# Patient Record
Sex: Female | Born: 1985 | Race: White | Hispanic: No | Marital: Single | Smoking: Current every day smoker
Health system: Southern US, Community
[De-identification: ages and names within clinical notes are randomized; demographics above are authoritative.]

## PROBLEM LIST (undated history)

## (undated) ENCOUNTER — Inpatient Hospital Stay (HOSPITAL_COMMUNITY): Payer: Self-pay

## (undated) ENCOUNTER — Inpatient Hospital Stay: Admission: EM | Payer: Self-pay | Source: Home / Self Care

## (undated) DIAGNOSIS — M5126 Other intervertebral disc displacement, lumbar region: Secondary | ICD-10-CM

## (undated) DIAGNOSIS — E559 Vitamin D deficiency, unspecified: Secondary | ICD-10-CM

## (undated) DIAGNOSIS — F988 Other specified behavioral and emotional disorders with onset usually occurring in childhood and adolescence: Secondary | ICD-10-CM

## (undated) DIAGNOSIS — D649 Anemia, unspecified: Secondary | ICD-10-CM

## (undated) DIAGNOSIS — J45909 Unspecified asthma, uncomplicated: Secondary | ICD-10-CM

## (undated) HISTORY — DX: Unspecified asthma, uncomplicated: J45.909

## (undated) HISTORY — PX: UTERINE SUSPENSION: SUR1430

## (undated) HISTORY — DX: Other intervertebral disc displacement, lumbar region: M51.26

## (undated) HISTORY — PX: IUD REMOVAL: SHX5392

## (undated) HISTORY — DX: Vitamin D deficiency, unspecified: E55.9

## (undated) HISTORY — PX: DIAGNOSTIC LAPAROSCOPY: SUR761

---
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
## 2008-03-20 ENCOUNTER — Other Ambulatory Visit: Admission: RE | Admit: 2008-03-20 | Discharge: 2008-03-20 | Payer: Self-pay | Admitting: Obstetrics and Gynecology

## 2008-09-26 ENCOUNTER — Inpatient Hospital Stay (HOSPITAL_COMMUNITY): Admission: AD | Admit: 2008-09-26 | Discharge: 2008-09-26 | Payer: Self-pay | Admitting: Family Medicine

## 2008-10-25 ENCOUNTER — Inpatient Hospital Stay (HOSPITAL_COMMUNITY): Admission: AD | Admit: 2008-10-25 | Discharge: 2008-10-25 | Payer: Self-pay | Admitting: Family Medicine

## 2008-10-31 ENCOUNTER — Ambulatory Visit: Payer: Self-pay | Admitting: Obstetrics & Gynecology

## 2008-10-31 ENCOUNTER — Inpatient Hospital Stay (HOSPITAL_COMMUNITY): Admission: AD | Admit: 2008-10-31 | Discharge: 2008-11-04 | Payer: Self-pay | Admitting: Obstetrics & Gynecology

## 2008-11-01 ENCOUNTER — Encounter: Payer: Self-pay | Admitting: Obstetrics & Gynecology

## 2009-01-29 ENCOUNTER — Ambulatory Visit (HOSPITAL_COMMUNITY): Admission: RE | Admit: 2009-01-29 | Discharge: 2009-01-29 | Payer: Self-pay | Admitting: Obstetrics and Gynecology

## 2009-02-13 ENCOUNTER — Ambulatory Visit (HOSPITAL_COMMUNITY): Admission: RE | Admit: 2009-02-13 | Discharge: 2009-02-13 | Payer: Self-pay | Admitting: Obstetrics and Gynecology

## 2009-03-28 ENCOUNTER — Ambulatory Visit (HOSPITAL_COMMUNITY): Admission: RE | Admit: 2009-03-28 | Discharge: 2009-03-28 | Payer: Self-pay | Admitting: Obstetrics and Gynecology

## 2010-05-02 LAB — CBC
HCT: 39.1 % (ref 36.0–46.0)
Hemoglobin: 12.9 g/dL (ref 12.0–15.0)
MCHC: 32.9 g/dL (ref 30.0–36.0)
MCV: 84.3 fL (ref 78.0–100.0)
Platelets: 252 10*3/uL (ref 150–400)
WBC: 7.1 10*3/uL (ref 4.0–10.5)

## 2010-05-02 LAB — HCG, QUANTITATIVE, PREGNANCY: hCG, Beta Chain, Quant, S: <2 m[IU]/mL (ref <5)

## 2010-05-17 LAB — COMPREHENSIVE METABOLIC PANEL
Albumin: 2.9 g/dL — ABNORMAL LOW (ref 3.5–5.2)
Alkaline Phosphatase: 261 U/L — ABNORMAL HIGH (ref 39–117)
Calcium: 9.3 mg/dL (ref 8.4–10.5)
Chloride: 107 mEq/L (ref 96–112)
GFR calc non Af Amer: >60 mL/min (ref >60)
Glucose, Bld: 116 mg/dL — ABNORMAL HIGH (ref 70–99)
Potassium: 4 mEq/L (ref 3.5–5.1)
Sodium: 136 mEq/L (ref 135–145)
Total Bilirubin: 0.6 mg/dL (ref 0.3–1.2)

## 2010-05-17 LAB — CBC
HCT: 20.2 % — ABNORMAL LOW (ref 36.0–46.0)
HCT: 20.5 % — ABNORMAL LOW (ref 36.0–46.0)
HCT: 34.4 % — ABNORMAL LOW (ref 36.0–46.0)
Hemoglobin: 6.9 g/dL — CL (ref 12.0–15.0)
Hemoglobin: 6.9 g/dL — CL (ref 12.0–15.0)
Hemoglobin: 7.1 g/dL — CL (ref 12.0–15.0)
MCHC: 33.3 g/dL (ref 30.0–36.0)
MCHC: 33.7 g/dL (ref 30.0–36.0)
MCHC: 34 g/dL (ref 30.0–36.0)
MCV: 91.2 fL (ref 78.0–100.0)
MCV: 92.1 fL (ref 78.0–100.0)
MCV: 92.5 fL (ref 78.0–100.0)
Platelets: 154 10*3/uL (ref 150–400)
Platelets: 186 10*3/uL (ref 150–400)
RBC: 2.19 MIL/uL — ABNORMAL LOW (ref 3.87–5.11)
RDW: 13.6 % (ref 11.5–15.5)
WBC: 10.5 10*3/uL (ref 4.0–10.5)
WBC: 8.1 10*3/uL (ref 4.0–10.5)

## 2010-05-17 LAB — DIFFERENTIAL
Eosinophils Relative: 1 % (ref 0–5)
Lymphocytes Relative: 10 % — ABNORMAL LOW (ref 12–46)
Lymphs Abs: 1.4 10*3/uL (ref 0.7–4.0)
Monocytes Absolute: 1.1 10*3/uL — ABNORMAL HIGH (ref 0.1–1.0)
Neutrophils Relative %: 81 % — ABNORMAL HIGH (ref 43–77)

## 2010-05-18 LAB — GC/CHLAMYDIA PROBE AMP, GENITAL: Chlamydia, DNA Probe: NEGATIVE

## 2010-05-18 LAB — WET PREP, GENITAL: Clue Cells Wet Prep HPF POC: NONE SEEN

## 2010-05-21 ENCOUNTER — Other Ambulatory Visit: Payer: Self-pay | Admitting: Obstetrics & Gynecology

## 2010-05-21 ENCOUNTER — Other Ambulatory Visit (HOSPITAL_COMMUNITY)
Admission: RE | Admit: 2010-05-21 | Discharge: 2010-05-21 | Disposition: A | Discharge status: Home / Self Care | Payer: BC Managed Care – PPO | Source: Ambulatory Visit | Attending: Obstetrics & Gynecology | Admitting: Obstetrics & Gynecology

## 2010-05-21 DIAGNOSIS — R87810 Cervical high risk human papillomavirus (HPV) DNA test positive: Secondary | ICD-10-CM | POA: Insufficient documentation

## 2010-05-21 DIAGNOSIS — Z01419 Encounter for gynecological examination (general) (routine) without abnormal findings: Secondary | ICD-10-CM | POA: Insufficient documentation

## 2010-07-03 ENCOUNTER — Other Ambulatory Visit: Payer: Self-pay | Admitting: Obstetrics & Gynecology

## 2010-08-01 ENCOUNTER — Other Ambulatory Visit: Payer: Self-pay | Admitting: Obstetrics & Gynecology

## 2010-08-15 ENCOUNTER — Ambulatory Visit (HOSPITAL_COMMUNITY)
Admission: RE | Admit: 2010-08-15 | Discharge: 2010-08-15 | Disposition: A | Discharge status: Home / Self Care | Payer: BC Managed Care – PPO | Source: Ambulatory Visit | Attending: Obstetrics & Gynecology | Admitting: Obstetrics & Gynecology

## 2010-08-15 ENCOUNTER — Other Ambulatory Visit (HOSPITAL_COMMUNITY): Payer: BC Managed Care – PPO

## 2010-08-15 ENCOUNTER — Other Ambulatory Visit: Payer: Self-pay | Admitting: Obstetrics & Gynecology

## 2010-08-15 ENCOUNTER — Encounter (HOSPITAL_COMMUNITY): Payer: Self-pay

## 2010-08-15 HISTORY — DX: Anemia, unspecified: D64.9

## 2010-08-15 HISTORY — DX: Other specified behavioral and emotional disorders with onset usually occurring in childhood and adolescence: F98.8

## 2010-08-15 LAB — CBC
HCT: 37.1 % (ref 36.0–46.0)
Hemoglobin: 12.6 g/dL (ref 12.0–15.0)
MCH: 31.8 pg (ref 26.0–34.0)
MCHC: 34 g/dL (ref 30.0–36.0)

## 2010-08-15 LAB — COMPREHENSIVE METABOLIC PANEL
BUN: 16 mg/dL (ref 6–23)
Calcium: 9.4 mg/dL (ref 8.4–10.5)
GFR calc Af Amer: >60 mL/min (ref >60)
Glucose, Bld: 70 mg/dL (ref 70–99)
Sodium: 138 mEq/L (ref 135–145)
Total Protein: 6.7 g/dL (ref 6.0–8.3)

## 2010-08-15 LAB — URINALYSIS, ROUTINE W REFLEX MICROSCOPIC
Nitrite: NEGATIVE
Specific Gravity, Urine: 1.025 (ref 1.005–1.030)
Urobilinogen, UA: 0.2 mg/dL (ref 0.0–1.0)

## 2010-08-15 LAB — HCG, QUANTITATIVE, PREGNANCY: hCG, Beta Chain, Quant, S: 1 m[IU]/mL (ref <5)

## 2010-08-15 LAB — URINE MICROSCOPIC-ADD ON

## 2010-08-15 NOTE — Patient Instructions (Addendum)
20 Allison Miles  08/15/2010   Your procedure is scheduled on:  08-21-10  Report to [facility] at 730 AM.  Call this number if you have problems the morning of surgery: [phone]   Remember:   Do not eat food:After Midnight.  Do not drink clear liquids: After Midnight.  Take these medicines the morning of surgery with A SIP OF WATER:  none   Do not wear jewelry, make-up or nail polish.  Do not bring valuables to the hospital.  Contacts, dentures or bridgework may not be worn into surgery.  Leave suitcase in the car. After surgery it may be brought to your room.  For patients admitted to the hospital, checkout time is 11:00 AM the day of discharge.   Patients discharged the day of surgery will not be allowed to drive home.  Name and phone number of your driver: Family member  Special Instructions: CHG Shower Shower 2 days before surgery and 1 day before surgery with Hibiclens.   Please read over the following fact sheets that you were given: Pain Booklet, MRSA Information, Surgical Site Infection Prevention and Anesthesia Post-op Instructions PATIENT INSTRUCTIONS POST-ANESTHESIA  IMMEDIATELY FOLLOWING SURGERY:  Do not drive or operate machinery for the first twenty four hours after surgery.  Do not make any important decisions for twenty four hours after surgery or while taking narcotic pain medications or sedatives.  If you develop intractable nausea and vomiting or a severe headache please notify your doctor immediately.  FOLLOW-UP:  Please make an appointment with your surgeon as instructed. You do not need to follow up with anesthesia unless specifically instructed to do so.  WOUND CARE INSTRUCTIONS (if applicable):  Keep a dry clean dressing on the anesthesia/puncture wound site if there is drainage.  Once the wound has quit draining you may leave it open to air.  Generally you should leave the bandage intact for twenty four hours unless there is drainage.  If the epidural site  drains for more than 36-48 hours please call the anesthesia department.  QUESTIONS?:  Please feel free to call your physician or the hospital operator if you have any questions, and they will be happy to assist you.     [facility] Anesthesia Department [address] [place] [phone]

## 2010-08-15 NOTE — H&P (Signed)
Allison Miles is an 24 y.o. female. [name] had a pap smear on 05/21/2010 which revealed atypical squamous cells of undetermined significance, cannot rule out high grade dysplasia.  I performed a colposcopically directed exam and biopsy on 07/03/2010 and the pathology report returned as high grade squamous intraepithelial lesion.  The colposcopy  was adequate.  As a result she is scheduled for outpatient laser ablation/conization of the cervix.  Pertinent Gynecological History: Menses: flow is light, usually lasting less than 6 days and with minimal cramping Bleeding: regular Contraception: NuvaRing vaginal inserts DES exposure: denies Blood transfusions: none Sexually transmitted diseases: negative Previous GYN Procedures:laparoscopy Last mammogram: N/A Date:  Last pap: abnormal:  Date: 4/2012OB History: G1, P1   Menstrual History: Menarche age:12  Patient's last menstrual period was 07/25/2010.    Past Medical History  Diagnosis Date  . Angina   . Anemia   . ADD (attention deficit disorder) without hyperactivity     Past Surgical History  Procedure Date  . Diagnostic laparoscopy     tacked pelvic ligaments due to prolapse and removed iud    Family History  Problem Relation Age of Onset  . Anesthesia problems Neg Hx   . Hypotension Neg Hx   . Malignant hyperthermia Neg Hx   . Pseudochol deficiency Neg Hx     Social History:  reports that she has been smoking Cigarettes.  She has a 2 pack-year smoking history. She does not have any smokeless tobacco history on file. She reports that she drinks about 1.2 ounces of alcohol per week. She reports that she uses illicit drugs (Marijuana).  Allergies: No Known Allergies   (Not in a hospital admission)  Review of Systems  Constitutional: Negative.   HENT: Negative.   Eyes: Negative.   Respiratory: Negative.   Cardiovascular: Negative.   Gastrointestinal: Negative.   Genitourinary: Negative.   Musculoskeletal: Negative.    Skin: Negative.   Neurological: Negative.   Endo/Heme/Allergies: Negative.   Psychiatric/Behavioral: Negative.     Last menstrual period 07/25/2010. Physical Exam  Constitutional: She is oriented to person, place, and time. She appears well-developed and well-nourished.  HENT:  Head: Normocephalic and atraumatic.  Neck: Normal range of motion. Neck supple. No tracheal deviation present. No thyromegaly present.  Cardiovascular: Normal rate, regular rhythm and normal heart sounds.   Respiratory: Effort normal and breath sounds normal. No respiratory distress. She has no wheezes. She has no rales. She exhibits no tenderness.  GI: Hernia confirmed negative in the right inguinal area and confirmed negative in the left inguinal area.  Genitourinary: Rectum normal and uterus normal. No breast swelling, tenderness, discharge or bleeding. Pelvic exam was performed with patient supine. No labial fusion. There is no rash, tenderness, lesion or injury on the right labia. There is no rash, tenderness, lesion or injury on the left labia. Uterus is not deviated, not enlarged, not fixed and not tender. Cervix exhibits no motion tenderness, no discharge and no friability. Right adnexum displays no mass, no tenderness and no fullness. Left adnexum displays no mass, no tenderness and no fullness. No erythema, tenderness or bleeding around the vagina. No foreign body around the vagina. No signs of injury around the vagina. No vaginal discharge found.  Musculoskeletal: Normal range of motion. She exhibits no edema and no tenderness.  Lymphadenopathy:       Right: No inguinal adenopathy present.       Left: No inguinal adenopathy present.  Neurological: She is alert and oriented to person, place,   and time. She has normal reflexes.  Skin: Skin is warm and dry.  Psychiatric: She has a normal mood and affect. Her behavior is normal. Judgment and thought content normal.     @RESULTS24@  @RISRSLT48@  Assessment/Plan: 1.  High Grade Cervical Dysplasia  Laser ablation/conization of the cervix  EURE,LUTHER H 08/15/2010, 12:41 PM  

## 2010-08-18 ENCOUNTER — Other Ambulatory Visit: Payer: Self-pay | Admitting: Obstetrics & Gynecology

## 2010-08-21 ENCOUNTER — Encounter (HOSPITAL_COMMUNITY): Payer: Self-pay | Admitting: *Deleted

## 2010-08-21 ENCOUNTER — Encounter (HOSPITAL_COMMUNITY)
Admission: RE | Disposition: A | Discharge status: Home / Self Care | Payer: Self-pay | Source: Ambulatory Visit | Attending: Obstetrics & Gynecology

## 2010-08-21 ENCOUNTER — Ambulatory Visit (HOSPITAL_COMMUNITY)
Admission: RE | Admit: 2010-08-21 | Discharge: 2010-08-21 | Disposition: A | Discharge status: Home / Self Care | Payer: BC Managed Care – PPO | Source: Ambulatory Visit | Attending: Obstetrics & Gynecology | Admitting: Obstetrics & Gynecology

## 2010-08-21 ENCOUNTER — Ambulatory Visit (HOSPITAL_COMMUNITY): Payer: BC Managed Care – PPO | Admitting: *Deleted

## 2010-08-21 DIAGNOSIS — Z30432 Encounter for removal of intrauterine contraceptive device: Secondary | ICD-10-CM | POA: Insufficient documentation

## 2010-08-21 DIAGNOSIS — Z87898 Personal history of other specified conditions: Secondary | ICD-10-CM

## 2010-08-21 DIAGNOSIS — N854 Malposition of uterus: Secondary | ICD-10-CM | POA: Insufficient documentation

## 2010-08-21 SURGERY — ABLATION, CERVIX
Anesthesia: General | Site: Cervix | Wound class: Clean Contaminated

## 2010-08-21 MED ORDER — LACTATED RINGERS IV SOLN
INTRAVENOUS | Status: DC
  Administered 2010-08-21: 09:00:00 via INTRAVENOUS

## 2010-08-21 MED ORDER — KETOROLAC TROMETHAMINE 30 MG/ML IJ SOLN
30.0000 mg | Freq: Once | INTRAMUSCULAR | Status: AC
  Administered 2010-08-21: 30 mg via INTRAVENOUS

## 2010-08-21 MED ORDER — CEFAZOLIN SODIUM 1-5 GM-% IV SOLN
INTRAVENOUS | Status: DC | PRN
  Administered 2010-08-21: 1 g via INTRAVENOUS

## 2010-08-21 MED ORDER — MIDAZOLAM HCL 2 MG/2ML IJ SOLN
1.0000 mg | INTRAMUSCULAR | Status: DC | PRN
  Administered 2010-08-21: 2 mg via INTRAVENOUS

## 2010-08-21 MED ORDER — FENTANYL CITRATE 0.05 MG/ML IJ SOLN
INTRAMUSCULAR | Status: DC | PRN
Start: 2010-08-21 — End: 2010-08-21
  Administered 2010-08-21 (×2): 50 ug via INTRAVENOUS

## 2010-08-21 MED ORDER — KETOROLAC TROMETHAMINE 30 MG/ML IJ SOLN: 30.0000 mg | Freq: Once | INTRAMUSCULAR | Status: DC

## 2010-08-21 MED ORDER — SODIUM CHLORIDE 0.9 % IV SOLN
INTRAVENOUS | Status: DC
  Filled 2010-08-21: qty 1000

## 2010-08-21 MED ORDER — FENTANYL CITRATE 0.05 MG/ML IJ SOLN
INTRAMUSCULAR | Status: AC
  Administered 2010-08-21: 25 ug via INTRAVENOUS
  Filled 2010-08-21: qty 2

## 2010-08-21 MED ORDER — LIDOCAINE HCL (PF) 1 % IJ SOLN
INTRAMUSCULAR | Status: AC
  Filled 2010-08-21: qty 5

## 2010-08-21 MED ORDER — DEXTROSE 5 % IV SOLN
INTRAVENOUS | Status: AC
  Filled 2010-08-21: qty 1

## 2010-08-21 MED ORDER — PROPOFOL 10 MG/ML IV EMUL
INTRAVENOUS | Status: DC | PRN
  Administered 2010-08-21: 150 mg via INTRAVENOUS

## 2010-08-21 MED ORDER — PROMETHAZINE HCL 12.5 MG PO TABS: 25.0000 mg | ORAL_TABLET | Freq: Four times a day (QID) | ORAL | Status: AC | PRN

## 2010-08-21 MED ORDER — CEFAZOLIN SODIUM 1-5 GM-% IV SOLN: 1.0000 g | INTRAVENOUS | Status: DC

## 2010-08-21 MED ORDER — FENTANYL CITRATE 0.05 MG/ML IJ SOLN
INTRAMUSCULAR | Status: AC
  Filled 2010-08-21: qty 2

## 2010-08-21 MED ORDER — CEFOXITIN SODIUM-DEXTROSE 1-4 GM-% IV SOLR (PREMIX): 1.0000 g | INTRAVENOUS | Status: DC

## 2010-08-21 MED ORDER — ACETIC ACID 4% SOLUTION
Status: DC | PRN
  Administered 2010-08-21: 1 via TOPICAL

## 2010-08-21 MED ORDER — MIDAZOLAM HCL 2 MG/2ML IJ SOLN
INTRAMUSCULAR | Status: AC
  Filled 2010-08-21: qty 2

## 2010-08-21 MED ORDER — MIDAZOLAM HCL 5 MG/5ML IJ SOLN
INTRAMUSCULAR | Status: DC | PRN
  Administered 2010-08-21: 2 mg via INTRAVENOUS

## 2010-08-21 MED ORDER — PROPOFOL 10 MG/ML IV EMUL
INTRAVENOUS | Status: AC
  Filled 2010-08-21: qty 20

## 2010-08-21 MED ORDER — CEFAZOLIN SODIUM 1-5 GM-% IV SOLN
INTRAVENOUS | Status: AC
  Administered 2010-08-21: 1000 mg
  Filled 2010-08-21: qty 50

## 2010-08-21 MED ORDER — FENTANYL CITRATE 0.05 MG/ML IJ SOLN
25.0000 ug | INTRAMUSCULAR | Status: DC | PRN
  Administered 2010-08-21 (×2): 25 ug via INTRAVENOUS

## 2010-08-21 MED ORDER — FERRIC SUBSULFATE 259 MG/GM EX SOLN
CUTANEOUS | Status: AC
  Filled 2010-08-21: qty 8

## 2010-08-21 MED ORDER — ONDANSETRON HCL 4 MG/2ML IJ SOLN: 4.0000 mg | Freq: Once | INTRAMUSCULAR | Status: DC | PRN

## 2010-08-21 MED ORDER — HYDROCODONE-ACETAMINOPHEN 5-500 MG PO TABS: 1.0000 | ORAL_TABLET | Freq: Four times a day (QID) | ORAL | Status: AC | PRN

## 2010-08-21 MED ORDER — KETOROLAC TROMETHAMINE 30 MG/ML IJ SOLN
INTRAMUSCULAR | Status: AC
  Administered 2010-08-21: 30 mg via INTRAVENOUS
  Filled 2010-08-21: qty 1

## 2010-08-21 MED ORDER — FERRIC SUBSULFATE 259 MG/GM EX SOLN
CUTANEOUS | Status: DC | PRN
  Administered 2010-08-21: 1

## 2010-08-21 SURGICAL SUPPLY — 21 items
APPLICATOR COTTON TIP 6IN STRL (MISCELLANEOUS) ×2 IMPLANT
BAG HAMPER (MISCELLANEOUS) ×2 IMPLANT
CLOTH BEACON ORANGE TIMEOUT ST (SAFETY) ×2 IMPLANT
COVER LIGHT HANDLE STERIS (MISCELLANEOUS) ×4 IMPLANT
COVER TABLE BACK 60X90 (DRAPES) ×2 IMPLANT
GAUZE SPONGE 4X4 16PLY XRAY LF (GAUZE/BANDAGES/DRESSINGS) ×2 IMPLANT
GLOVE BIOGEL PI IND STRL 8 (GLOVE) ×1 IMPLANT
GLOVE BIOGEL PI INDICATOR 8 (GLOVE) ×1
GLOVE ECLIPSE 8.0 STRL XLNG CF (GLOVE) ×2 IMPLANT
GOWN BRE IMP SLV AUR XL STRL (GOWN DISPOSABLE) ×2 IMPLANT
GOWN STRL REIN XL XLG (GOWN DISPOSABLE) IMPLANT
KIT ROOM TURNOVER APOR (KITS) ×2 IMPLANT
LASER FIBER DISP 1000U (UROLOGICAL SUPPLIES) ×2 IMPLANT
MANIFOLD NEPTUNE II (INSTRUMENTS) ×2 IMPLANT
MARKER SKIN DUAL TIP RULER LAB (MISCELLANEOUS) ×2 IMPLANT
PAD ARMBOARD 7.5X6 YLW CONV (MISCELLANEOUS) ×2 IMPLANT
PREFILTER SMOKE EVAC (FILTER) ×2 IMPLANT
SET BASIN LINEN APH (SET/KITS/TRAYS/PACK) ×2 IMPLANT
SWAB PROCTOSCOPIC (MISCELLANEOUS) ×4 IMPLANT
TUBING SMOKE EVAC CO2 (TUBING) ×2 IMPLANT
WATER STERILE IRR 1000ML POUR (IV SOLUTION) ×4 IMPLANT

## 2010-08-21 NOTE — Anesthesia Preprocedure Evaluation (Signed)
Anesthesia Evaluation  Name, MR# and DOB Patient awake  General Assessment Comment  Reviewed: Allergy & Precautions, H&P  and Patient's Chart, lab work & pertinent test results  History of Anesthesia Complications Negative for: history of anesthetic complications  Airway Mallampati: I TM Distance: >3 FB Neck ROM: Full    Dental  (+) Teeth Intact,    Pulmonary    pulmonary exam normal   Cardiovascular + angina (secondary to ADD meds - resolved) Regular Normal   Neuro/Psych (+) {AN ROS/MED HX NEURO HEADACHES (+) PSYCHIATRIC DISORDERS (ADD),  Pos THC use GI/Hepatic/Renal   Endo/Other   Abdominal   Musculoskeletal  Hematology   Peds  Reproductive/Obstetrics   Anesthesia Other Findings             Anesthesia Physical Anesthesia Plan  ASA: II  Anesthesia Plan: General   Post-op Pain Management:    Induction: Intravenous  Airway Management Planned: LMA  Additional Equipment:   Intra-op Plan:   Post-operative Plan: Extubation in OR  Informed Consent: I have reviewed the patients History and Physical, chart, labs and discussed the procedure including the risks, benefits and alternatives for the proposed anesthesia with the patient or authorized representative who has indicated his/her understanding and acceptance.     Plan Discussed with: CRNA  Anesthesia Plan Comments:         Anesthesia Quick Evaluation

## 2010-08-21 NOTE — Op Note (Signed)
Preoperative Diagnosis:  High Grade Squamous Intraepithelial lesion, adequate colposcopy  Postoperative Diagnosis:  Same as above  Procedure:  Laser ablation of the cervix  Surgeon:  Lazaro Arms MD  Anaesthesia:  Laryngeal Mask Airway  Findings:  Patient had an abnormal pap smear which was evaluated in the office with colposcopy and directed biopsies.  Pathology report returned as high Grade SIL.  The colposcopy was adequate.  As a result, the patient is admitted for laser ablation of the cervix.  Description of Note:  Patient was taken to the OR and placed in the supine position where she underwent laryngeal mask airway anaesthesia.  She was placed in the dorsal lithotomy position.  She was draped for laser.  Graves speculum was placed and 3% acetic acid used and the laser microscope employed to perform colposcopy which confirmed the office findings.  Laser was used on typical cervical settings and used to vaporized the squamocolumnar junction to  depth of  5-7 mm peripherally and 7-9 mm centrally.  Surgical margin of several mm was employed beyond the acetowhite epithelium.  Hemostasis was achieved with the laser and Monsel's solution.  Patient was awakened from anaesthesia in good stable condition and all counts were correct.  She received Ancef 1 gram and Toradol 30 mg IV preoperatively prophylactically.

## 2010-08-21 NOTE — H&P (View-Only) (Signed)
Allison Miles is an 25 y.o. female. Allison Miles had a pap smear on 05/21/2010 which revealed atypical squamous cells of undetermined significance, cannot rule out high grade dysplasia.  I performed a colposcopically directed exam and biopsy on 07/03/2010 and the pathology report returned as high grade squamous intraepithelial lesion.  The colposcopy  was adequate.  As a result she is scheduled for outpatient laser ablation/conization of the cervix.  Pertinent Gynecological History: Menses: flow is light, usually lasting less than 6 days and with minimal cramping Bleeding: regular Contraception: NuvaRing vaginal inserts DES exposure: denies Blood transfusions: none Sexually transmitted diseases: negative Previous GYN Procedures:laparoscopy Last mammogram: N/A Date:  Last pap: abnormal:  Date: 4/2012OB History: G1, P1   Menstrual History: Menarche age:32  Patient's last menstrual period was 07/25/2010.    Past Medical History  Diagnosis Date  . Angina   . Anemia   . ADD (attention deficit disorder) without hyperactivity     Past Surgical History  Procedure Date  . Diagnostic laparoscopy     tacked pelvic ligaments due to prolapse and removed iud    Family History  Problem Relation Age of Onset  . Anesthesia problems Neg Hx   . Hypotension Neg Hx   . Malignant hyperthermia Neg Hx   . Pseudochol deficiency Neg Hx     Social History:  reports that she has been smoking Cigarettes.  She has a 2 pack-year smoking history. She does not have any smokeless tobacco history on file. She reports that she drinks about 1.2 ounces of alcohol per week. She reports that she uses illicit drugs (Marijuana).  Allergies: No Known Allergies   (Not in a hospital admission)  Review of Systems  Constitutional: Negative.   HENT: Negative.   Eyes: Negative.   Respiratory: Negative.   Cardiovascular: Negative.   Gastrointestinal: Negative.   Genitourinary: Negative.   Musculoskeletal: Negative.    Skin: Negative.   Neurological: Negative.   Endo/Heme/Allergies: Negative.   Psychiatric/Behavioral: Negative.     Last menstrual period 07/25/2010. Physical Exam  Constitutional: She is oriented to person, place, and time. She appears well-developed and well-nourished.  HENT:  Head: Normocephalic and atraumatic.  Neck: Normal range of motion. Neck supple. No tracheal deviation present. No thyromegaly present.  Cardiovascular: Normal rate, regular rhythm and normal heart sounds.   Respiratory: Effort normal and breath sounds normal. No respiratory distress. She has no wheezes. She has no rales. She exhibits no tenderness.  GI: Hernia confirmed negative in the right inguinal area and confirmed negative in the left inguinal area.  Genitourinary: Rectum normal and uterus normal. No breast swelling, tenderness, discharge or bleeding. Pelvic exam was performed with patient supine. No labial fusion. There is no rash, tenderness, lesion or injury on the right labia. There is no rash, tenderness, lesion or injury on the left labia. Uterus is not deviated, not enlarged, not fixed and not tender. Cervix exhibits no motion tenderness, no discharge and no friability. Right adnexum displays no mass, no tenderness and no fullness. Left adnexum displays no mass, no tenderness and no fullness. No erythema, tenderness or bleeding around the vagina. No foreign body around the vagina. No signs of injury around the vagina. No vaginal discharge found.  Musculoskeletal: Normal range of motion. She exhibits no edema and no tenderness.  Lymphadenopathy:       Right: No inguinal adenopathy present.       Left: No inguinal adenopathy present.  Neurological: She is alert and oriented to person, place,  and time. She has normal reflexes.  Skin: Skin is warm and dry.  Psychiatric: She has a normal mood and affect. Her behavior is normal. Judgment and thought content normal.     @RESULTS24 @  @RISRSLT48 @  Assessment/Plan: 1.  High Grade Cervical Dysplasia  Laser ablation/conization of the cervix  Allison Miles,Allison Miles 08/15/2010, 12:41 PM

## 2010-08-21 NOTE — Anesthesia Postprocedure Evaluation (Signed)
  Anesthesia Post-op Note  Patient: Allison Miles  Procedure(s) Performed:  CERVICAL ABLATION  Patient Location: PACU  Anesthesia Type: General  Level of Consciousness: awake  Airway and Oxygen Therapy: Patient Spontanous Breathing  Post-op Pain: none  Post-op Assessment: Patient's Cardiovascular Status Stable, Respiratory Function Stable, Patent Airway and No signs of Nausea or vomiting  Post-op Vital Signs: stable  Complications: No apparent anesthesia complications

## 2010-08-21 NOTE — Anesthesia Procedure Notes (Addendum)
Performed by: Glynn Octave Preoxygenation: Pre-oxygenation with 100% oxygen    Performed by: Glynn Octave    Procedure Name: LMA Insertion Date/Time: 08/21/2010 11:34 AM Performed by: Glynn Octave Pre-anesthesia Checklist: Patient identified, Patient being monitored, Emergency Drugs available, Timeout performed and Suction available Patient Re-evaluated:Patient Re-evaluated prior to inductionOxygen Delivery Method: Circle System Utilized Intubation Type: IV induction Ventilation: Mask ventilation without difficulty LMA: LMA inserted LMA Size: 4.0 Number of attempts: 1 Placement Confirmation: positive ETCO2 and breath sounds checked- equal and bilateral

## 2010-08-21 NOTE — Transfer of Care (Signed)
Immediate Anesthesia Transfer of Care Note  Patient: Allison Miles  Procedure(s) Performed:  CERVICAL ABLATION  Patient Location: PACU  Anesthesia Type: General  Level of Consciousness: awake, alert  and oriented  Airway & Oxygen Therapy: Patient Spontanous Breathing and Patient connected to face mask oxygen  Post-op Assessment: Report given to PACU RN, Post -op Vital signs reviewed and stable and Patient moving all extremities  Post vital signs: stable  Complications: No apparent anesthesia complications

## 2010-08-21 NOTE — Interval H&P Note (Signed)
History and Physical Interval Note:   08/21/2010   7:49 AM   Allison Miles  has presented today for surgery, with the diagnosis of servere dysplasia of cervix  The various methods of treatment have been discussed with the patient and family. After consideration of risks, benefits and other options for treatment, the patient has consented to  Procedure(s): CERVICAL ABLATION as a surgical intervention .  I have reviewed the patients' chart and labs.  Questions were answered to the patient's satisfaction.     Lazaro Arms  MD  There have been no significant clinical changes since the completion of the above H&P.

## 2011-03-12 ENCOUNTER — Other Ambulatory Visit: Payer: Self-pay | Admitting: Obstetrics & Gynecology

## 2011-03-12 ENCOUNTER — Other Ambulatory Visit (HOSPITAL_COMMUNITY)
Admission: RE | Admit: 2011-03-12 | Discharge: 2011-03-12 | Disposition: A | Discharge status: Home / Self Care | Payer: Commercial Managed Care - PPO | Source: Ambulatory Visit | Attending: Obstetrics & Gynecology | Admitting: Obstetrics & Gynecology

## 2011-03-12 DIAGNOSIS — Z01419 Encounter for gynecological examination (general) (routine) without abnormal findings: Secondary | ICD-10-CM | POA: Insufficient documentation

## 2011-12-26 ENCOUNTER — Encounter (HOSPITAL_COMMUNITY): Payer: Self-pay | Admitting: *Deleted

## 2011-12-26 ENCOUNTER — Inpatient Hospital Stay (HOSPITAL_COMMUNITY)
Admission: AD | Admit: 2011-12-26 | Discharge: 2011-12-26 | Disposition: A | Discharge status: Home / Self Care | Payer: Medicaid Other | Source: Ambulatory Visit | Attending: Obstetrics and Gynecology | Admitting: Obstetrics and Gynecology

## 2011-12-26 DIAGNOSIS — R109 Unspecified abdominal pain: Secondary | ICD-10-CM | POA: Insufficient documentation

## 2011-12-26 DIAGNOSIS — K529 Noninfective gastroenteritis and colitis, unspecified: Secondary | ICD-10-CM

## 2011-12-26 DIAGNOSIS — J069 Acute upper respiratory infection, unspecified: Secondary | ICD-10-CM | POA: Insufficient documentation

## 2011-12-26 DIAGNOSIS — O99891 Other specified diseases and conditions complicating pregnancy: Secondary | ICD-10-CM | POA: Insufficient documentation

## 2011-12-26 DIAGNOSIS — M545 Low back pain, unspecified: Secondary | ICD-10-CM | POA: Insufficient documentation

## 2011-12-26 DIAGNOSIS — E860 Dehydration: Secondary | ICD-10-CM | POA: Insufficient documentation

## 2011-12-26 DIAGNOSIS — K5289 Other specified noninfective gastroenteritis and colitis: Secondary | ICD-10-CM | POA: Insufficient documentation

## 2011-12-26 LAB — WET PREP, GENITAL
Trich, Wet Prep: NONE SEEN
Yeast Wet Prep HPF POC: NONE SEEN

## 2011-12-26 LAB — URINALYSIS, ROUTINE W REFLEX MICROSCOPIC
Glucose, UA: NEGATIVE mg/dL
Hgb urine dipstick: NEGATIVE
Specific Gravity, Urine: 1.02 (ref 1.005–1.030)

## 2011-12-26 NOTE — MAU Note (Signed)
Pt presents with complaints of cramping since last pm.

## 2011-12-26 NOTE — MAU Provider Note (Signed)
History     CSN: [id number]  Arrival date and time: 12/26/11 1731   None     Chief Complaint  Patient presents with  . Abdominal Pain   HPI 26 y.o. G2P1001 at [redacted]w[redacted]d with lower abdominal pain/cramping and low back pain since last night. Then nausea and vomiting today with headache. Cramping comes in waves, sometimes feels like contractions every 8 to 10 min for a while and then goes away. No bleeding or loss fluid. But is feeling some increased vaginal pressure off and on. Normal vaginal discharge. Has had URI with cough and green sputum lately. Cough makes pain/pressure worse.   OB History    Grav Para Term Preterm Abortions TAB SAB Ect Mult Living   2 1 1  0 0 0 0 0 0 1      Past Medical History  Diagnosis Date  . Angina   . Anemia   . ADD (attention deficit disorder) without hyperactivity     Past Surgical History  Procedure Date  . Diagnostic laparoscopy     tacked pelvic ligaments due to prolapse and removed iud    Family History  Problem Relation Age of Onset  . Anesthesia problems Neg Hx   . Hypotension Neg Hx   . Malignant hyperthermia Neg Hx   . Pseudochol deficiency Neg Hx     History  Substance Use Topics  . Smoking status: Current Every Day Smoker -- 1.0 packs/day for 2 years    Types: Cigarettes  . Smokeless tobacco: Not on file  . Alcohol Use: 1.2 oz/week    2 Cans of beer per week  No EtOH since pregnant.  Allergies:  Allergies  Allergen Reactions  . Lisdexamfetamine Other (See Comments)    "Pounding Heart" Pt. States  It made her heart beat fast, that she really wasn't allergic to it.    Prescriptions prior to admission  Medication Sig Dispense Refill  . acetaminophen (TYLENOL) 500 MG tablet Take 1,000 mg by mouth every 6 (six) hours as needed. Head ache      . Pediatric Multiple Vit-C-FA (FLINSTONES GUMMIES OMEGA-3 DHA) CHEW Chew 1 tablet by mouth daily.        Review of Systems  Constitutional: Negative for fever and chills.    Gastrointestinal: Positive for nausea, vomiting and abdominal pain. Negative for diarrhea and constipation.  Genitourinary: Negative for dysuria and urgency.  Musculoskeletal: Positive for back pain.  Neurological: Positive for headaches (resolved). Negative for dizziness.   Physical Exam   Blood pressure 122/75, pulse 95, resp. rate 16, height 5\' 10"  (1.778 m), weight 87.544 kg (193 lb), last menstrual period 06/24/2011.  Physical Exam  Constitutional: She is oriented to person, place, and time. She appears well-developed and well-nourished. No distress.  HENT:  Head: Normocephalic and atraumatic.  Eyes: Conjunctivae normal and EOM are normal.  Neck: Normal range of motion. Neck supple.  Cardiovascular: Normal rate, regular rhythm and normal heart sounds.   Respiratory: Effort normal and breath sounds normal.  GI: Bowel sounds are normal. There is tenderness (mild suprapubic). There is no rebound and no guarding.  Genitourinary:       External genitalia normal. Normal vagina. Small amount mucous discharge and some milky discharge. Cervix visually < 0.5 cm.   Musculoskeletal: Normal range of motion. She exhibits no edema and no tenderness.  Neurological: She is alert and oriented to person, place, and time.  Skin: Skin is warm and dry.  Psychiatric: She has a normal mood and affect.  Results for orders placed during the hospital encounter of 12/26/11 (from the past 24 hour(s))  URINALYSIS, ROUTINE W REFLEX MICROSCOPIC     Status: Abnormal   Collection Time   12/26/11  5:50 PM      Component Value Range   Color, Urine YELLOW  YELLOW   APPearance HAZY (*) CLEAR   Specific Gravity, Urine 1.020  1.005 - 1.030   pH 7.0  5.0 - 8.0   Glucose, UA NEGATIVE  NEGATIVE mg/dL   Hgb urine dipstick NEGATIVE  NEGATIVE   Bilirubin Urine NEGATIVE  NEGATIVE   Ketones, ur 40 (*) NEGATIVE mg/dL   Protein, ur NEGATIVE  NEGATIVE mg/dL   Urobilinogen, UA 0.2  0.0 - 1.0 mg/dL   Nitrite NEGATIVE   NEGATIVE   Leukocytes, UA NEGATIVE  NEGATIVE  WET PREP, GENITAL     Status: Abnormal   Collection Time   12/26/11  6:20 PM      Component Value Range   Yeast Wet Prep HPF POC NONE SEEN  NONE SEEN   Trich, Wet Prep NONE SEEN  NONE SEEN   Clue Cells Wet Prep HPF POC NONE SEEN  NONE SEEN   WBC, Wet Prep HPF POC FEW (*) NONE SEEN    MAU Course  Procedures  NST:  125, moderate variability, accels present, occasional short variable TOCO: no contractions, some irritability   Assessment and Plan  26 y.o. G2P1001 at [redacted]w[redacted]d with mild dehydration due to URI/Gastroenteritis - Encourage PO fluids - Not contracting, no cervical change - NST reactive  Napoleon Form, MD  Napoleon Form 12/26/2011, 6:08 PM

## 2012-02-11 NOTE — L&D Delivery Note (Signed)
I examined pt and agree with documentation above and nurse midwife student plan of care. MUHAMMAD,Leomar Westberg  

## 2012-02-11 NOTE — L&D Delivery Note (Signed)
Delivery Note At 3:47 PM a viable female was delivered via Vaginal, Spontaneous Delivery (Presentation: LOA  ).  APGAR: 9, 9; weight: to be determined.   Placenta status: Intact, Spontaneous.  Cord: 3 vessels. Baby delivered with double tight nuchal cord that was not easily reduced. Cord was cut and untangled. Patient crying during entire time.   Anesthesia: Epidural  Episiotomy: None Lacerations: 2nd degree Suture Repair: 3.0 vicryl Est. Blood Loss (mL): 300  Mom to postpartum.  Baby to nursery-stable.  [name] 03/17/2012, 4:37 PM  Eino Farber Jerol[facility] was present throughout delivery and performed the repair.

## 2012-03-11 ENCOUNTER — Inpatient Hospital Stay (HOSPITAL_COMMUNITY)
Admission: AD | Admit: 2012-03-11 | Discharge: 2012-03-11 | Disposition: A | Discharge status: Home / Self Care | Payer: Medicaid Other | Source: Ambulatory Visit | Attending: Obstetrics and Gynecology | Admitting: Obstetrics and Gynecology

## 2012-03-11 ENCOUNTER — Encounter (HOSPITAL_COMMUNITY): Payer: Self-pay | Admitting: *Deleted

## 2012-03-11 DIAGNOSIS — O479 False labor, unspecified: Secondary | ICD-10-CM

## 2012-03-11 NOTE — ED Provider Notes (Signed)
None     Chief Complaint:  Labor Eval   Allison Miles is  27 y.o. G2P1001 at [redacted]w[redacted]d presents complaining of Labor Eval .  She states regular, every 4 minutes contractions are associated with none vaginal bleeding, intact membranes, along with active fetal movement. Contractions started 2/5 hours ago, getting regular about 1 hour ago  Obstetrical/Gynecological History: Menstrual History: OB History    Grav Para Term Preterm Abortions TAB SAB Ect Mult Living   2 1 1  0 0 0 0 0 0 1       Patient's last menstrual period was 06/24/2011.     Past Medical History: Past Medical History  Diagnosis Date  . Anemia   . ADD (attention deficit disorder) without hyperactivity     Past Surgical History: Past Surgical History  Procedure Date  . Diagnostic laparoscopy     tacked pelvic ligaments due to prolapse and removed iud  . Cesarean section   . Uterine suspension   . Iud removal     Family History: Family History  Problem Relation Age of Onset  . Anesthesia problems Neg Hx   . Hypotension Neg Hx   . Malignant hyperthermia Neg Hx   . Pseudochol deficiency Neg Hx     Social History: History  Substance Use Topics  . Smoking status: Current Every Day Smoker -- 1.0 packs/day for 2 years    Types: Cigarettes  . Smokeless tobacco: Not on file  . Alcohol Use: No    Allergies:  Allergies  Allergen Reactions  . Lisdexamfetamine Other (See Comments)    "Pounding Heart" Pt. States  It made her heart beat fast, that she really wasn't allergic to it.    Meds:  Prescriptions prior to admission  Medication Sig Dispense Refill  . acetaminophen (TYLENOL) 500 MG tablet Take 1,000 mg by mouth every 6 (six) hours as needed. Head ache      . calcium carbonate (TUMS - DOSED IN MG ELEMENTAL CALCIUM) 500 MG chewable tablet Chew 1 tablet by mouth daily.      . metroNIDAZOLE (FLAGYL) 500 MG tablet Take 500 mg by mouth 2 (two) times daily.      . Pediatric Multiple Vit-C-FA  (FLINSTONES GUMMIES OMEGA-3 DHA) CHEW Chew 1 tablet by mouth daily.        Review of Systems - Please refer to the aforementioned patients' reports.     Physical Exam  Blood pressure 121/79, pulse 100, temperature 97.8 F (36.6 C), temperature source Oral, resp. rate 18, height 5\' 10"  (1.778 m), weight 216 lb 9.6 oz (98.249 kg), last menstrual period 06/24/2011. GENERAL: Well-developed, well-nourished female in no acute distress.  LUNGS: Clear to auscultation bilaterally.  HEART: Regular rate and rhythm. ABDOMEN: Soft, nontender, nondistended, gravid.  EXTREMITIES: Nontender, no edema, 2+ distal pulses. CERVICAL EXAM: Dilatation 2.5cm   Effacement 50%   Station -2   Presentation: cephalic FHT:  Baseline rate 140 bpm   Variability moderate  Accelerations present   Decelerations none Contractions: Every 3-4 mins   Labs: No results found for this or any previous visit (from the past 24 hour(s)). Imaging Studies:  No results found.  Assessment: Allison Miles is  27 y.o. G2P1001 at [redacted]w[redacted]d presents with possible early vs false labor.  Plan: Observe for a while--pt is a TOLAC and lives 45 minutes away  Addendum:  After several hours, pt's contractions dwindled down to almost nothing.  No change in cervix.  Pt d/c'd home with diagnosis of false  labor  Allison Miles,Allison Miles 1/30/20142:27 AM

## 2012-03-11 NOTE — MAU Note (Signed)
Pt G2 P1 at 37.2wks having contractions every since 2350.  Denies leaking or bleeding.

## 2012-03-11 NOTE — MAU Note (Signed)
Pt up to bathroom.

## 2012-03-17 ENCOUNTER — Inpatient Hospital Stay (HOSPITAL_COMMUNITY)
Admission: AD | Admit: 2012-03-17 | Discharge: 2012-03-18 | DRG: 775 | Disposition: A | Discharge status: Home / Self Care | Payer: Medicaid Other | Source: Ambulatory Visit | Attending: Obstetrics and Gynecology | Admitting: Obstetrics and Gynecology

## 2012-03-17 ENCOUNTER — Encounter (HOSPITAL_COMMUNITY): Payer: Self-pay | Admitting: *Deleted

## 2012-03-17 ENCOUNTER — Inpatient Hospital Stay (HOSPITAL_COMMUNITY): Payer: Medicaid Other | Admitting: Anesthesiology

## 2012-03-17 ENCOUNTER — Encounter (HOSPITAL_COMMUNITY): Payer: Self-pay | Admitting: Anesthesiology

## 2012-03-17 DIAGNOSIS — O99344 Other mental disorders complicating childbirth: Secondary | ICD-10-CM

## 2012-03-17 DIAGNOSIS — O34219 Maternal care for unspecified type scar from previous cesarean delivery: Secondary | ICD-10-CM | POA: Diagnosis present

## 2012-03-17 DIAGNOSIS — O99334 Smoking (tobacco) complicating childbirth: Secondary | ICD-10-CM | POA: Diagnosis present

## 2012-03-17 DIAGNOSIS — F1210 Cannabis abuse, uncomplicated: Secondary | ICD-10-CM | POA: Diagnosis present

## 2012-03-17 LAB — TYPE AND SCREEN
ABO/RH(D): A POS
Antibody Screen: NEGATIVE

## 2012-03-17 LAB — CBC
HCT: 35.8 % — ABNORMAL LOW (ref 36.0–46.0)
MCH: 30.3 pg (ref 26.0–34.0)
MCV: 88.8 fL (ref 78.0–100.0)
RBC: 4.03 MIL/uL (ref 3.87–5.11)
RDW: 13.2 % (ref 11.5–15.5)
WBC: 15.6 10*3/uL — ABNORMAL HIGH (ref 4.0–10.5)

## 2012-03-17 LAB — OB RESULTS CONSOLE ABO/RH: RH Type: POSITIVE

## 2012-03-17 LAB — OB RESULTS CONSOLE GBS: GBS: NEGATIVE

## 2012-03-17 LAB — OB RESULTS CONSOLE HIV ANTIBODY (ROUTINE TESTING): HIV: NONREACTIVE

## 2012-03-17 LAB — OB RESULTS CONSOLE GC/CHLAMYDIA
Chlamydia: NEGATIVE
Gonorrhea: NEGATIVE

## 2012-03-17 LAB — OB RESULTS CONSOLE HEPATITIS B SURFACE ANTIGEN: Hepatitis B Surface Ag: NEGATIVE

## 2012-03-17 LAB — OB RESULTS CONSOLE ANTIBODY SCREEN: Antibody Screen: NEGATIVE

## 2012-03-17 LAB — OB RESULTS CONSOLE RUBELLA ANTIBODY, IGM: Rubella: IMMUNE

## 2012-03-17 MED ORDER — OXYCODONE-ACETAMINOPHEN 5-325 MG PO TABS: 1.0000 | ORAL_TABLET | ORAL | Status: DC | PRN

## 2012-03-17 MED ORDER — FENTANYL 2.5 MCG/ML BUPIVACAINE 1/10 % EPIDURAL INFUSION (WH - ANES)
INTRAMUSCULAR | Status: AC
  Filled 2012-03-17: qty 125

## 2012-03-17 MED ORDER — LANOLIN HYDROUS EX OINT: TOPICAL_OINTMENT | CUTANEOUS | Status: DC | PRN

## 2012-03-17 MED ORDER — FENTANYL 2.5 MCG/ML BUPIVACAINE 1/10 % EPIDURAL INFUSION (WH - ANES)
INTRAMUSCULAR | Status: DC | PRN
  Administered 2012-03-17: 16 mL/h via EPIDURAL

## 2012-03-17 MED ORDER — LIDOCAINE HCL (PF) 1 % IJ SOLN
30.0000 mL | INTRAMUSCULAR | Status: DC | PRN
  Administered 2012-03-17: 30 mL via SUBCUTANEOUS
  Filled 2012-03-17: qty 30

## 2012-03-17 MED ORDER — NALBUPHINE SYRINGE 5 MG/0.5 ML: 5.0000 mg | INJECTION | INTRAMUSCULAR | Status: DC | PRN

## 2012-03-17 MED ORDER — IBUPROFEN 600 MG PO TABS
600.0000 mg | ORAL_TABLET | Freq: Four times a day (QID) | ORAL | Status: DC
  Administered 2012-03-17 – 2012-03-18 (×5): 600 mg via ORAL
  Filled 2012-03-17 (×5): qty 1

## 2012-03-17 MED ORDER — FLEET ENEMA 7-19 GM/118ML RE ENEM: 1.0000 | ENEMA | RECTAL | Status: DC | PRN

## 2012-03-17 MED ORDER — PHENYLEPHRINE 40 MCG/ML (10ML) SYRINGE FOR IV PUSH (FOR BLOOD PRESSURE SUPPORT): 80.0000 ug | PREFILLED_SYRINGE | INTRAVENOUS | Status: DC | PRN

## 2012-03-17 MED ORDER — OXYTOCIN BOLUS FROM INFUSION: 500.0000 mL | INTRAVENOUS | Status: DC

## 2012-03-17 MED ORDER — EPHEDRINE 5 MG/ML INJ: 10.0000 mg | INTRAVENOUS | Status: DC | PRN

## 2012-03-17 MED ORDER — PHENYLEPHRINE 40 MCG/ML (10ML) SYRINGE FOR IV PUSH (FOR BLOOD PRESSURE SUPPORT)
PREFILLED_SYRINGE | INTRAVENOUS | Status: AC
  Filled 2012-03-17: qty 5

## 2012-03-17 MED ORDER — DIPHENHYDRAMINE HCL 50 MG/ML IJ SOLN: 12.5000 mg | INTRAMUSCULAR | Status: DC | PRN

## 2012-03-17 MED ORDER — OXYTOCIN 40 UNITS IN LACTATED RINGERS INFUSION - SIMPLE MED
62.5000 mL/h | INTRAVENOUS | Status: DC
  Administered 2012-03-17: 62.5 mL/h via INTRAVENOUS

## 2012-03-17 MED ORDER — LACTATED RINGERS IV SOLN: 500.0000 mL | Freq: Once | INTRAVENOUS | Status: DC

## 2012-03-17 MED ORDER — SIMETHICONE 80 MG PO CHEW: 80.0000 mg | CHEWABLE_TABLET | ORAL | Status: DC | PRN

## 2012-03-17 MED ORDER — ZOLPIDEM TARTRATE 5 MG PO TABS: 5.0000 mg | ORAL_TABLET | Freq: Every evening | ORAL | Status: DC | PRN

## 2012-03-17 MED ORDER — ONDANSETRON HCL 4 MG/2ML IJ SOLN: 4.0000 mg | Freq: Four times a day (QID) | INTRAMUSCULAR | Status: DC | PRN

## 2012-03-17 MED ORDER — OXYTOCIN 40 UNITS IN LACTATED RINGERS INFUSION - SIMPLE MED
INTRAVENOUS | Status: AC
  Filled 2012-03-17: qty 1000

## 2012-03-17 MED ORDER — ONDANSETRON HCL 4 MG/2ML IJ SOLN: 4.0000 mg | INTRAMUSCULAR | Status: DC | PRN

## 2012-03-17 MED ORDER — FENTANYL 2.5 MCG/ML BUPIVACAINE 1/10 % EPIDURAL INFUSION (WH - ANES): 14.0000 mL/h | INTRAMUSCULAR | Status: DC

## 2012-03-17 MED ORDER — PRENATAL MULTIVITAMIN CH
1.0000 | ORAL_TABLET | Freq: Every day | ORAL | Status: DC
  Administered 2012-03-18: 1 via ORAL
  Filled 2012-03-17: qty 1

## 2012-03-17 MED ORDER — EPHEDRINE 5 MG/ML INJ
INTRAVENOUS | Status: AC
  Filled 2012-03-17: qty 4

## 2012-03-17 MED ORDER — DIBUCAINE 1 % RE OINT: 1.0000 "application " | TOPICAL_OINTMENT | RECTAL | Status: DC | PRN

## 2012-03-17 MED ORDER — DIPHENHYDRAMINE HCL 25 MG PO CAPS: 25.0000 mg | ORAL_CAPSULE | Freq: Four times a day (QID) | ORAL | Status: DC | PRN

## 2012-03-17 MED ORDER — OXYCODONE-ACETAMINOPHEN 5-325 MG PO TABS
1.0000 | ORAL_TABLET | ORAL | Status: DC | PRN
  Administered 2012-03-17 – 2012-03-18 (×5): 1 via ORAL
  Filled 2012-03-17 (×6): qty 1

## 2012-03-17 MED ORDER — ACETAMINOPHEN 325 MG PO TABS: 650.0000 mg | ORAL_TABLET | ORAL | Status: DC | PRN

## 2012-03-17 MED ORDER — IBUPROFEN 600 MG PO TABS: 600.0000 mg | ORAL_TABLET | Freq: Four times a day (QID) | ORAL | Status: DC | PRN

## 2012-03-17 MED ORDER — BENZOCAINE-MENTHOL 20-0.5 % EX AERO
1.0000 "application " | INHALATION_SPRAY | CUTANEOUS | Status: DC | PRN
  Administered 2012-03-17: 1 via TOPICAL
  Filled 2012-03-17: qty 56

## 2012-03-17 MED ORDER — CITRIC ACID-SODIUM CITRATE 334-500 MG/5ML PO SOLN: 30.0000 mL | ORAL | Status: DC | PRN

## 2012-03-17 MED ORDER — TETANUS-DIPHTH-ACELL PERTUSSIS 5-2.5-18.5 LF-MCG/0.5 IM SUSP
0.5000 mL | Freq: Once | INTRAMUSCULAR | Status: AC
  Administered 2012-03-18: 0.5 mL via INTRAMUSCULAR
  Filled 2012-03-17: qty 0.5

## 2012-03-17 MED ORDER — SENNOSIDES-DOCUSATE SODIUM 8.6-50 MG PO TABS
2.0000 | ORAL_TABLET | Freq: Every day | ORAL | Status: DC
  Administered 2012-03-17: 2 via ORAL

## 2012-03-17 MED ORDER — ONDANSETRON HCL 4 MG PO TABS: 4.0000 mg | ORAL_TABLET | ORAL | Status: DC | PRN

## 2012-03-17 MED ORDER — BENZONATATE 100 MG PO CAPS
100.0000 mg | ORAL_CAPSULE | Freq: Three times a day (TID) | ORAL | Status: DC | PRN
  Administered 2012-03-17 – 2012-03-18 (×2): 100 mg via ORAL
  Filled 2012-03-17 (×4): qty 1

## 2012-03-17 MED ORDER — LACTATED RINGERS IV SOLN
INTRAVENOUS | Status: DC
  Administered 2012-03-17 (×2): via INTRAVENOUS

## 2012-03-17 MED ORDER — WITCH HAZEL-GLYCERIN EX PADS: 1.0000 "application " | MEDICATED_PAD | CUTANEOUS | Status: DC | PRN

## 2012-03-17 MED ORDER — LIDOCAINE HCL (PF) 1 % IJ SOLN
INTRAMUSCULAR | Status: DC | PRN
  Administered 2012-03-17 (×2): 5 mL

## 2012-03-17 MED ORDER — LACTATED RINGERS IV SOLN: 500.0000 mL | INTRAVENOUS | Status: DC | PRN

## 2012-03-17 NOTE — Anesthesia Preprocedure Evaluation (Signed)
Anesthesia Evaluation  Patient identified by MRN, date of birth, ID band Patient awake    Reviewed: Allergy & Precautions, H&P , Patient's Chart, lab work & pertinent test results  Airway Mallampati: III TM Distance: >3 FB Neck ROM: full    Dental No notable dental hx. (+) Teeth Intact   Pulmonary Current Smoker,  breath sounds clear to auscultation  Pulmonary exam normal       Cardiovascular negative cardio ROS  Rhythm:regular Rate:Normal     Neuro/Psych ADD negative psych ROS   GI/Hepatic Neg liver ROS, GERD-  Controlled and Medicated,  Endo/Other  negative endocrine ROS  Renal/GU negative Renal ROS  negative genitourinary   Musculoskeletal   Abdominal   Peds  Hematology negative hematology ROS (+) anemia ,   Anesthesia Other Findings   Reproductive/Obstetrics (+) Pregnancy                           Anesthesia Physical Anesthesia Plan  ASA: II  Anesthesia Plan: Epidural   Post-op Pain Management:    Induction:   Airway Management Planned:   Additional Equipment:   Intra-op Plan:   Post-operative Plan:   Informed Consent: I have reviewed the patients History and Physical, chart, labs and discussed the procedure including the risks, benefits and alternatives for the proposed anesthesia with the patient or authorized representative who has indicated his/her understanding and acceptance.     Plan Discussed with: Anesthesiologist  Anesthesia Plan Comments:         Anesthesia Quick Evaluation

## 2012-03-17 NOTE — Anesthesia Postprocedure Evaluation (Signed)
  Anesthesia Post-op Note  Patient: Allison Miles  Procedure(s) Performed: * No procedures listed *  Patient Location: Mother/Baby  Anesthesia Type:Epidural  Level of Consciousness: awake, alert  and oriented  Airway and Oxygen Therapy: Patient Spontanous Breathing  Post-op Pain: none  Post-op Assessment: Post-op Vital signs reviewed  Post-op Vital Signs: Reviewed and stable  Complications: No apparent anesthesia complications

## 2012-03-17 NOTE — MAU Note (Signed)
Started at 0500, every 4 minutes. No problems, prior c/s plans for vbac.  Was checked yesterday was 2+.  Little bit of brownish d/c this morning, ? Leaking this mornning.

## 2012-03-17 NOTE — Progress Notes (Signed)
   Subjective: Reports increased pressure.  Objective: BP 127/71  Pulse 89  Temp 97.9 F (36.6 C) (Oral)  Resp 18  Ht 5\' 8"  (1.727 m)  Wt 97.977 kg (216 lb)  BMI 32.84 kg/m2  SpO2 99%  LMP 06/24/2011      FHT:  FHR: 130's bpm, variability: moderate,  accelerations:  Present,  decelerations:  Absent UC:   regular, every 2-3 minutes SVE:   Dilation: 10 Effacement (%): 100 Station: +1 Exam by:: lee  Labs: Lab Results  Component Value Date   WBC 15.6* 03/17/2012   HGB 12.2 03/17/2012   HCT 35.8* 03/17/2012   MCV 88.8 03/17/2012   PLT 273 03/17/2012    Assessment / Plan: Spontaneous labor, progressing normally  Labor: Progressing normally Preeclampsia:  n/a Fetal Wellbeing:  Category I Pain Control:  Epidural I/D:  n/a Anticipated MOD:  NSVD AROM > non particulate meconium. [facility] 03/17/2012, 3:03 PM

## 2012-03-17 NOTE — H&P (Signed)
Allison Miles is a 27 y.o. G2P1001 at 38.1 wga by LMP presenting for contractions and evaluation for labor.  Started feeling contractions every 7 minutes at 4am today. She reports some brown discharge today. No vaginal bleeding. Felt some fluid today in the MAU.  Fern negative x2.   Pregnancy complicated by:  - smoker: cigs 1 pack per day - umbilical hernia, reduced - THC positive on UDS - TOLAC; c-section in 2010 for failure to descend (consent papers signed and in chart)  History OB History    Grav Para Term Preterm Abortions TAB SAB Ect Mult Living   2 1 1  0 0 0 0 0 0 1     Past Medical History  Diagnosis Date  . Anemia   . ADD (attention deficit disorder) without hyperactivity    Past Surgical History  Procedure Date  . Diagnostic laparoscopy     tacked pelvic ligaments due to prolapse and removed iud  . Cesarean section   . Uterine suspension   . Iud removal    Family History: family history is negative for Anesthesia problems, and Hypotension, and Malignant hyperthermia, and Pseudochol deficiency, . Social History:  reports that she has been smoking Cigarettes.  She has a 2 pack-year smoking history. She has never used smokeless tobacco. She reports that she uses illicit drugs (Marijuana). She reports that she does not drink alcohol.   Prenatal Transfer Tool  Maternal Diabetes: No Genetic Screening: Normal Maternal Ultrasounds/Referrals: Abnormal:  Findings:   Other:EICF noted on 20.2wga resolved at 31wga Fetal Ultrasounds or other Referrals:  None Maternal Substance Abuse:  Yes:  Type: Smoker, Marijuana Significant Maternal Medications:  None Significant Maternal Lab Results:  Lab values include: Other: GBS unknown, patient thinks she was told it was negative Other Comments:  None  ROS Negative except per HPI Dilation: 7 Effacement (%): 100 Station: -1 Exam by:: Dr. Whitney Muse. Carrera, RNC Blood pressure 118/78, pulse 105, temperature 97.9 F (36.6 C),  temperature source Oral, resp. rate 20, height 5\' 8"  (1.727 m), weight 216 lb (97.977 kg), last menstrual period 06/24/2011. Exam Physical Exam Physical Examination: General appearance - alert, well appearing, and in no distress Chest - clear to auscultation, no wheezes, rales or rhonchi, symmetric air entry Heart - normal rate, regular rhythm, normal S1, S2, no murmurs, rubs, clicks or gallops Pelvic - normal external genitalia, vulva, vagina, no pooling noted, bulging bag on cervical check Extremities - no pedal edema noted  negative fern x2  FHT: 130, minimal variability, present accels, 1 variable decel, no late Cntx: every 2-3 minutes Prenatal labs: ABO, Rh:  A pos Antibody:   Rubella:  immune RPR:   neg HBsAg:   neg HIV:   neg GBS:   unknown  Assessment/Plan:  27 y.o. G2P1001 at 38.1 wga by LMP presenting in active labor. - admit to L&D - epidural for pain. nubain until then - GBS unknown: no need for PPx given no fever, no rupture of membranes, full term - TOLAC: consent signed.  - category 2 - expected mode of delivery: SVD   [name], PGY-2 Family Medicine Resident

## 2012-03-17 NOTE — Anesthesia Procedure Notes (Signed)
Epidural Patient location during procedure: OB Start time: 03/17/2012 1:03 PM  Staffing Anesthesiologist: Justyn Langham A. Performed by: anesthesiologist   Preanesthetic Checklist Completed: patient identified, site marked, surgical consent, pre-op evaluation, timeout performed, IV checked, risks and benefits discussed and monitors and equipment checked  Epidural Patient position: sitting Prep: site prepped and draped and DuraPrep Patient monitoring: continuous pulse ox and blood pressure Approach: midline Injection technique: LOR air  Needle:  Needle type: Tuohy  Needle gauge: 17 G Needle length: 9 cm and 9 Needle insertion depth: 5 cm cm Catheter type: closed end flexible Catheter size: 19 Gauge Catheter at skin depth: 10 cm Test dose: negative and Other  Assessment Events: blood not aspirated, injection not painful, no injection resistance, negative IV test and no paresthesia  Additional Notes Patient identified. Risks and benefits discussed including failed block, incomplete  Pain control, post dural puncture headache, nerve damage, paralysis, blood pressure Changes, nausea, vomiting, reactions to medications-both toxic and allergic and post Partum back pain. All questions were answered. Patient expressed understanding and wished to proceed. Sterile technique was used throughout procedure. Epidural site was Dressed with sterile barrier dressing. No paresthesias, signs of intravascular injection Or signs of intrathecal spread were encountered.  Patient was more comfortable after the epidural was dosed. Please see RN's note for documentation of vital signs and FHR which are stable.

## 2012-03-18 LAB — CBC
Hemoglobin: 9.9 g/dL — ABNORMAL LOW (ref 12.0–15.0)
MCH: 29.7 pg (ref 26.0–34.0)
MCV: 90.1 fL (ref 78.0–100.0)
Platelets: 215 10*3/uL (ref 150–400)
RBC: 3.33 MIL/uL — ABNORMAL LOW (ref 3.87–5.11)

## 2012-03-18 MED ORDER — IBUPROFEN 600 MG PO TABS: 600.0000 mg | ORAL_TABLET | Freq: Four times a day (QID) | ORAL | Status: DC

## 2012-03-18 MED ORDER — FERROUS SULFATE 325 (65 FE) MG PO TABS: 325.0000 mg | ORAL_TABLET | Freq: Two times a day (BID) | ORAL | Status: DC

## 2012-03-18 MED ORDER — OXYCODONE-ACETAMINOPHEN 5-325 MG PO TABS: 1.0000 | ORAL_TABLET | ORAL | Status: DC | PRN

## 2012-03-18 NOTE — Progress Notes (Signed)
UR chart review completed.  

## 2012-03-18 NOTE — Discharge Summary (Signed)
Obstetric Discharge Summary Allison Miles is a 27 y.o. G2P2002 presenting at [redacted]w[redacted]d in active labor. She progressed to complete dilation and had a successful VBAC with 2nd degree perineal laceration. Her postpartum course was unremarkable. She is breastfeeding and plans Implanon for contraception.  Reason for Admission: onset of labor Prenatal Procedures: none Intrapartum Procedures: spontaneous vaginal delivery and VBAC Postpartum Procedures: none Complications-Operative and Postpartum: 2nd degree perineal laceration Hemoglobin  Date Value Range Status  03/18/2012 9.9* 12.0 - 15.0 g/dL Final     REPEATED TO VERIFY     DELTA CHECK NOTED     HCT  Date Value Range Status  03/18/2012 30.0* 36.0 - 46.0 % Final    Physical Exam:  General: alert, cooperative and no distress Lochia: appropriate Uterine Fundus: firm DVT Evaluation: No evidence of DVT seen on physical exam. Negative Homan's sign. No cords or calf tenderness. No significant calf/ankle edema.  Discharge Diagnoses: Term Pregnancy-delivered and VBAC  Discharge Information: Date: 03/18/2012 Activity: pelvic rest Diet: routine Medications: PNV, Ibuprofen, Iron and Percocet Condition: stable Instructions: refer to practice specific booklet Discharge to: home Follow-up Information    Follow up with [facility]. In 6 weeks.   Contact information:   [address] [place] [address] [phone]         Newborn Data: Live born female  Birth Weight: 7 lb 7 oz (3374 g) APGAR: 9, 9  Home with mother.  Napoleon Form 03/18/2012, 9:16 AM

## 2012-03-21 NOTE — H&P (Signed)
I examined pt and agree with documentation above and resident plan of care. MUHAMMAD,Juvia Aerts  

## 2012-04-03 NOTE — ED Provider Notes (Signed)
Attestation of Attending Supervision of Advanced Practitioner: Evaluation and management procedures were performed by the PA/NP/CNM/OB Fellow under my supervision/collaboration. Chart reviewed and agree with management and plan.  Genifer Lazenby V 04/03/2012 6:10 AM    

## 2012-04-29 ENCOUNTER — Ambulatory Visit (INDEPENDENT_AMBULATORY_CARE_PROVIDER_SITE_OTHER): Payer: Medicaid Other | Admitting: Advanced Practice Midwife

## 2012-04-29 ENCOUNTER — Encounter: Payer: Self-pay | Admitting: Advanced Practice Midwife

## 2012-04-29 NOTE — Progress Notes (Deleted)
Patient ID: Allison Miles, female   DOB: Feb 17, 1985, 27 y.o.   MRN: [id number]  Subjective:    Allison Miles is a [id number] Unknown being seen today for her first obstetrical visit.  Her obstetrical history is significant for {ob risk factors:10154}. Patient {does/does not:19097} intend to breast feed. Pregnancy history fully reviewed.  Patient reports {sx:14538}.  There were no vitals filed for this visit.  HISTORY: OB History   Grav Para Term Preterm Abortions TAB SAB Ect Mult Living   2 2 2  0 0 0 0 0 0 2     # Outc Date GA Lbr Len/2nd Wgt Sex Del Anes PTL Lv   1 TRM 9/10    M LTCS EPI No Yes   2 TRM 2/14 [redacted]w[redacted]d 09:13 / 01:34 7lb7oz(3.374kg) F SVD EPI  Yes     Past Medical History  Diagnosis Date  . Anemia   . ADD (attention deficit disorder) without hyperactivity    Past Surgical History  Procedure Laterality Date  . Diagnostic laparoscopy      tacked pelvic ligaments due to prolapse and removed iud  . Cesarean section    . Uterine suspension    . Iud removal     Family History  Problem Relation Age of Onset  . Anesthesia problems Neg Hx   . Hypotension Neg Hx   . Malignant hyperthermia Neg Hx   . Pseudochol deficiency Neg Hx      Exam    Uterus:     Pelvic Exam:    Perineum: {Exam; anus and perineum:14598}   Vulva: {vulva exam:14487}   Vagina:  {vaginal exam:14498}   pH: ***   Cervix: {cervix exam:14595}   Adnexa: {adnexa:12223}   Bony Pelvis: {desc; pelvis:14491}  System: Breast:  {Exam; breast:13139::"normal appearance, no masses or tenderness"}   Skin: {pe skin brief ob:314459::"normal coloration and turgor, no rashes"}    Neurologic: {Exam; neuro:16375}   Extremities: {Exam; extremities:15096}   HEENT {exam; heent:31974}   Mouth/Teeth {pe mouth simple ob:314450::"mucous membranes moist, pharynx normal without lesions"}   Neck {Neck (ob):32092}   Cardiovascular: {HEART EXAM HEM/ONC:21750}   Respiratory:  {Exam; respiratory:5782::"appears well,  vitals normal, no respiratory distress, acyanotic, normal RR","ear and throat exam is normal","neck free of mass or lymphadenopathy","chest clear, no wheezing, crepitations, rhonchi, normal symmetric air entry"}   Abdomen: {Exam; abdomen:16834}   Urinary: {exam; urinary female:30845}      Assessment:    Pregnancy: G2P2002 There is no problem list on file for this patient.       Plan:     Initial labs drawn. Prenatal vitamins. Problem list reviewed and updated. Genetic Screening discussed {GENETIC SCREENING TEST:22046}: {requests/ordered/declines:14581}.  Ultrasound discussed; fetal survey: {requests/ordered/declines:14581}.  Follow up in {numbers 0-4:31231} weeks. ***% of *** min visit spent on counseling and coordination of care.  ***   CRESENZO-DISHMAN,Osmond Steckman 04/29/2012   Subjective:    Allison Miles is a [id number] Unknown being seen today for her first obstetrical visit.  Her obstetrical history is significant for {ob risk factors:10154}. Patient {does/does not:19097} intend to breast feed. Pregnancy history fully reviewed.  Patient reports {sx:14538}.  There were no vitals filed for this visit.  HISTORY: OB History   Grav Para Term Preterm Abortions TAB SAB Ect Mult Living   2 2 2  0 0 0 0 0 0 2     # Outc Date GA Lbr Len/2nd Wgt Sex Del Anes PTL Lv   1 TRM 9/10    M LTCS EPI No  Yes   2 TRM 2/14 [redacted]w[redacted]d 09:13 / 01:34 7lb7oz(3.374kg) F SVD EPI  Yes     Past Medical History  Diagnosis Date  . Anemia   . ADD (attention deficit disorder) without hyperactivity    Past Surgical History  Procedure Laterality Date  . Diagnostic laparoscopy      tacked pelvic ligaments due to prolapse and removed iud  . Cesarean section    . Uterine suspension    . Iud removal     Family History  Problem Relation Age of Onset  . Anesthesia problems Neg Hx   . Hypotension Neg Hx   . Malignant hyperthermia Neg Hx   . Pseudochol deficiency Neg Hx      Exam    Uterus:      Pelvic Exam:    Perineum: {Exam; anus and perineum:14598}   Vulva: {vulva exam:14487}   Vagina:  {vaginal exam:14498}   pH: ***   Cervix: {cervix exam:14595}   Adnexa: {adnexa:12223}   Bony Pelvis: {desc; pelvis:14491}  System: Breast:  {Exam; breast:13139::"normal appearance, no masses or tenderness"}   Skin: {pe skin brief ob:314459::"normal coloration and turgor, no rashes"}    Neurologic: {Exam; neuro:16375}   Extremities: {Exam; extremities:15096}   HEENT {exam; heent:31974}   Mouth/Teeth {pe mouth simple ob:314450::"mucous membranes moist, pharynx normal without lesions"}   Neck {Neck (ob):32092}   Cardiovascular: {HEART EXAM HEM/ONC:21750}   Respiratory:  {Exam; respiratory:5782::"appears well, vitals normal, no respiratory distress, acyanotic, normal RR","ear and throat exam is normal","neck free of mass or lymphadenopathy","chest clear, no wheezing, crepitations, rhonchi, normal symmetric air entry"}   Abdomen: {Exam; abdomen:16834}   Urinary: {exam; urinary female:30845}      Assessment:    Pregnancy: G2P2002 There is no problem list on file for this patient.       Plan:     Initial labs drawn. Prenatal vitamins. Problem list reviewed and updated. Genetic Screening discussed {GENETIC SCREENING TEST:22046}: {requests/ordered/declines:14581}.  Ultrasound discussed; fetal survey: {requests/ordered/declines:14581}.  Follow up in {numbers 0-4:31231} weeks. ***% of *** min visit spent on counseling and coordination of care.  ***   CRESENZO-DISHMAN,Eleanora Guinyard 04/29/2012

## 2012-04-29 NOTE — Progress Notes (Signed)
Allison Miles is a 27 y.o. who presents for a postpartum visit. She is 6 weeks postpartum following a VBAC. I have fully reviewed the prenatal and intrapartum course. The delivery was at 38.6 gestational weeks.  Anesthesia: epidural. Postpartum course has been uneventful. Baby's course has been uneventful. Baby is feeding by bottle. Bleeding no bleeding. Bowel function is normal. Bladder function is normal. Patient is not sexually active. Contraception method is Nexplanon. Postpartum depression screening: 10.  Pt denies PP depression.   Review of Systems Pertinent items are noted in HPI.   Objective:    There were no vitals taken for this visit.  General:  alert, cooperative and no distress   Breasts:  negative  Lungs: clear to auscultation bilaterally  Heart:  regular rate and rhythm  Abdomen: Soft, nontender   Vulva:  normal, well healed  Vagina: normal vagina  Cervix:  closed  Corpus: Well involuted     Rectal Exam: no hemorrhoids        Assessment:    normal postpartum exam.  Plan:    1. Contraception: Nexplanon 3. Follow up in: 2 weeks or as needed.

## 2012-05-06 ENCOUNTER — Other Ambulatory Visit: Payer: Medicaid Other

## 2012-05-06 ENCOUNTER — Ambulatory Visit: Payer: Medicaid Other | Admitting: Advanced Practice Midwife

## 2012-05-11 ENCOUNTER — Other Ambulatory Visit: Payer: Medicaid Other

## 2012-05-11 ENCOUNTER — Ambulatory Visit (INDEPENDENT_AMBULATORY_CARE_PROVIDER_SITE_OTHER): Payer: Medicaid Other | Admitting: Advanced Practice Midwife

## 2012-05-11 ENCOUNTER — Encounter: Payer: Self-pay | Admitting: Advanced Practice Midwife

## 2012-05-11 VITALS — BP 118/70 | Wt 200.6 lb

## 2012-05-11 DIAGNOSIS — Z30017 Encounter for initial prescription of implantable subdermal contraceptive: Secondary | ICD-10-CM

## 2012-05-11 DIAGNOSIS — Z3040 Encounter for surveillance of contraceptives, unspecified: Secondary | ICD-10-CM

## 2012-05-11 DIAGNOSIS — IMO0001 Reserved for inherently not codable concepts without codable children: Secondary | ICD-10-CM

## 2012-05-11 LAB — HCG, QUANTITATIVE, PREGNANCY: hCG, Beta Chain, Quant, S: <1 m[IU]/mL

## 2012-05-11 MED ORDER — ETONOGESTREL 68 MG ~~LOC~~ IMPL: 68.0000 mg | DRUG_IMPLANT | Freq: Once | SUBCUTANEOUS | Status: DC

## 2012-05-11 NOTE — Progress Notes (Signed)
Allison Miles is a 27 y.o.  year old Caucasian female here for Nexplanon insertion.  Her LMP was 04/22/12 , and her pregnancy test today was negative.  Risks/benefits/side effects of Nexplanon have been discussed and her questions have been answered.  Specifically, a failure rate of 02/998 has been reported, with an increased failure rate if pt takes St. John's Wort and/or antiseizure medicaitons.  Allison Miles  is aware of the common side effect of irregular bleeding, which the incidence of decreases over time.  Her left arm, approximatly 4 inches proximal from the elbow, was cleansed with alcohol and anesthetized with 2cc of 2% Lidocaine.  The area was cleansed again and the Nexplanon was inserted without difficulty.  A pressure bandage was applied.  Pt was instructed to remove pressure bandage in a few hours, and keep insertion site covered with a bandaid for 3 days.  Back up contraception was recommended for 2 weeks.  Follow-up scheduled PRN problems  CRESENZO-DISHMAN,Allison Miles 05/11/2012 3:05 PM

## 2012-05-11 NOTE — Patient Instructions (Addendum)
Remove pressure bandage in a few hours, and keep insertion site covered with a bandaid for 3 days.  Back up contraception (condoms) for 2 weeks.

## 2012-10-29 ENCOUNTER — Encounter (HOSPITAL_COMMUNITY): Payer: Self-pay | Admitting: *Deleted

## 2012-10-29 ENCOUNTER — Emergency Department (HOSPITAL_COMMUNITY)
Admission: EM | Admit: 2012-10-29 | Discharge: 2012-10-30 | Disposition: A | Discharge status: Home / Self Care | Payer: Medicaid Other | Attending: Emergency Medicine | Admitting: Emergency Medicine

## 2012-10-29 DIAGNOSIS — R52 Pain, unspecified: Secondary | ICD-10-CM | POA: Insufficient documentation

## 2012-10-29 DIAGNOSIS — R109 Unspecified abdominal pain: Secondary | ICD-10-CM | POA: Insufficient documentation

## 2012-10-29 DIAGNOSIS — Z8659 Personal history of other mental and behavioral disorders: Secondary | ICD-10-CM | POA: Insufficient documentation

## 2012-10-29 DIAGNOSIS — R51 Headache: Secondary | ICD-10-CM | POA: Insufficient documentation

## 2012-10-29 DIAGNOSIS — R059 Cough, unspecified: Secondary | ICD-10-CM | POA: Insufficient documentation

## 2012-10-29 DIAGNOSIS — D649 Anemia, unspecified: Secondary | ICD-10-CM | POA: Insufficient documentation

## 2012-10-29 DIAGNOSIS — Z87440 Personal history of urinary (tract) infections: Secondary | ICD-10-CM | POA: Insufficient documentation

## 2012-10-29 DIAGNOSIS — Z79899 Other long term (current) drug therapy: Secondary | ICD-10-CM | POA: Insufficient documentation

## 2012-10-29 DIAGNOSIS — F17200 Nicotine dependence, unspecified, uncomplicated: Secondary | ICD-10-CM | POA: Insufficient documentation

## 2012-10-29 DIAGNOSIS — N12 Tubulo-interstitial nephritis, not specified as acute or chronic: Secondary | ICD-10-CM

## 2012-10-29 DIAGNOSIS — R05 Cough: Secondary | ICD-10-CM | POA: Insufficient documentation

## 2012-10-29 LAB — URINALYSIS, ROUTINE W REFLEX MICROSCOPIC
Ketones, ur: 40 mg/dL — AB
Nitrite: NEGATIVE
pH: 6 (ref 5.0–8.0)

## 2012-10-29 LAB — URINE MICROSCOPIC-ADD ON

## 2012-10-29 MED ORDER — IBUPROFEN 800 MG PO TABS
ORAL_TABLET | ORAL | Status: AC
  Administered 2012-10-29: 800 mg via ORAL
  Filled 2012-10-29: qty 1

## 2012-10-29 MED ORDER — IBUPROFEN 800 MG PO TABS
800.0000 mg | ORAL_TABLET | Freq: Once | ORAL | Status: AC
  Administered 2012-10-29: 800 mg via ORAL

## 2012-10-29 MED ORDER — CIPROFLOXACIN HCL 500 MG PO TABS: 500.0000 mg | ORAL_TABLET | Freq: Two times a day (BID) | ORAL | Status: DC

## 2012-10-29 MED ORDER — ACETAMINOPHEN 500 MG PO TABS
ORAL_TABLET | ORAL | Status: AC
  Administered 2012-10-29: 500 mg
  Filled 2012-10-29: qty 2

## 2012-10-29 MED ORDER — SODIUM CHLORIDE 0.9 % IV BOLUS (SEPSIS)
1000.0000 mL | Freq: Once | INTRAVENOUS | Status: AC
  Administered 2012-10-29: 1000 mL via INTRAVENOUS

## 2012-10-29 MED ORDER — DEXTROSE 5 % IV SOLN
1.0000 g | Freq: Once | INTRAVENOUS | Status: AC
  Administered 2012-10-29: 1 g via INTRAVENOUS
  Filled 2012-10-29: qty 10

## 2012-10-29 NOTE — ED Provider Notes (Signed)
CSN: [id number]     Arrival date & time 10/29/12  1826 History  This chart was scribed for Allison Hutching, MD by Shari Heritage, ED Scribe. The patient was seen in room APA05/APA05. Patient's care was started at 7:26 PM.    Chief Complaint  Patient presents with  . Fever    The history is provided by the patient. No language interpreter was used.    HPI Comments: [name] is a 27 y.o. female who presents to the Emergency Department complaining of constant fever onset yesterday afternoon. She is also complaining of constant, moderate right flank pain that began yesterday as well. There is associated headache, body aches, chills and cough. She has a history of UTI. She states that she took amoxicillin this morning because she thought she may have a kidney infection. She has a medical history of anemia.   Past Medical History  Diagnosis Date  . Anemia   . ADD (attention deficit disorder) without hyperactivity    Past Surgical History  Procedure Laterality Date  . Diagnostic laparoscopy      tacked pelvic ligaments due to prolapse and removed iud  . Cesarean section    . Uterine suspension    . Iud removal     Family History  Problem Relation Age of Onset  . Anesthesia problems Neg Hx   . Hypotension Neg Hx   . Malignant hyperthermia Neg Hx   . Pseudochol deficiency Neg Hx   . Stroke Paternal Grandfather     also had ms  . Cancer Maternal Grandmother     brain  . Diabetes Maternal Uncle    History  Substance Use Topics  . Smoking status: Current Every Day Smoker -- 1.00 packs/day for 2 years    Types: Cigarettes  . Smokeless tobacco: Never Used  . Alcohol Use: 0.0 oz/week     Comment: occ drinks alcohol   OB History   Grav Para Term Preterm Abortions TAB SAB Ect Mult Living   2 2 2  0 0 0 0 0 0 2     Review of Systems A complete 10 system review of systems was obtained and all systems are negative except as noted in the HPI and PMH.   Allergies  Review of  patient's allergies indicates no known allergies.  Home Medications   Current Outpatient Rx  Name  Route  Sig  Dispense  Refill  . calcium carbonate (TUMS - DOSED IN MG ELEMENTAL CALCIUM) 500 MG chewable tablet   Oral   Chew 1 tablet by mouth daily.         . ferrous sulfate (FERROUSUL) 325 (65 FE) MG tablet   Oral   Take 1 tablet (325 mg total) by mouth 2 (two) times daily.   60 tablet   2   . ibuprofen (ADVIL,MOTRIN) 600 MG tablet   Oral   Take 1 tablet (600 mg total) by mouth every 6 (six) hours.   30 tablet   1   . oxyCODONE-acetaminophen (PERCOCET/ROXICET) 5-325 MG per tablet   Oral   Take 1 tablet by mouth every 4 (four) hours as needed (moderate - severe pain).   20 tablet   0    Triage Vitals: BP 110/71  Pulse 128  Temp(Src) 102.8 F (39.3 C) (Oral)  Resp 20  Ht 5\' 9"  (1.753 m)  Wt 192 lb (87.091 kg)  BMI 28.34 kg/m2  SpO2 99%  Breastfeeding? No Physical Exam  Nursing note and vitals reviewed. Constitutional:  She is oriented to person, place, and time. She appears well-developed and well-nourished.  HENT:  Head: Normocephalic and atraumatic.  Eyes: Conjunctivae and EOM are normal. Pupils are equal, round, and reactive to light.  Neck: Normal range of motion. Neck supple.  Cardiovascular: Normal rate, regular rhythm and normal heart sounds.   Pulmonary/Chest: Effort normal and breath sounds normal.  Abdominal: Soft. Bowel sounds are normal.  Minimal right CVA tenderness.  Musculoskeletal: Normal range of motion.  Neurological: She is alert and oriented to person, place, and time.  Skin: Skin is warm and dry.  Psychiatric: She has a normal mood and affect.    ED Course  Procedures (including critical care time) DIAGNOSTIC STUDIES: Oxygen Saturation is 99% on room air, normal by my interpretation.    COORDINATION OF CARE: 7:33 PM- Patient given Tylenol 1000 mg. Will order UA. Patient informed of current plan for treatment and evaluation and agrees  with plan at this time.     Labs Review Labs Reviewed  URINALYSIS, ROUTINE W REFLEX MICROSCOPIC - Abnormal; Notable for the following:    Hgb urine dipstick MODERATE (*)    Bilirubin Urine SMALL (*)    Ketones, ur 40 (*)    Protein, ur 100 (*)    Leukocytes, UA TRACE (*)    All other components within normal limits  URINE MICROSCOPIC-ADD ON - Abnormal; Notable for the following:    Bacteria, UA FEW (*)    Casts HYALINE CASTS (*)    All other components within normal limits  URINE CULTURE    Imaging Review No results found.  MDM  No diagnosis found. History and physical consistent with right pyelonephritis. Patient is hemodynamically stable. IV fluids. IV Rocephin. Urine culture. Discharge meds Cipro 500 mg twice a day for 10 days.  I personally performed the services described in this documentation, which was scribed in my presence. The recorded information has been reviewed and is accurate.    Allison Hutching, MD 10/29/12 [id number]

## 2012-10-29 NOTE — ED Notes (Signed)
Fever , chills, body aches.  No NVD, Cough,

## 2012-10-31 LAB — URINE CULTURE
Colony Count: NO GROWTH
Culture: NO GROWTH

## 2013-02-19 ENCOUNTER — Encounter (HOSPITAL_COMMUNITY): Payer: Self-pay | Admitting: Emergency Medicine

## 2013-02-19 ENCOUNTER — Emergency Department (HOSPITAL_COMMUNITY)
Admission: EM | Admit: 2013-02-19 | Discharge: 2013-02-19 | Disposition: A | Discharge status: Home / Self Care | Payer: Medicaid Other | Attending: Emergency Medicine | Admitting: Emergency Medicine

## 2013-02-19 DIAGNOSIS — F17200 Nicotine dependence, unspecified, uncomplicated: Secondary | ICD-10-CM | POA: Insufficient documentation

## 2013-02-19 DIAGNOSIS — IMO0001 Reserved for inherently not codable concepts without codable children: Secondary | ICD-10-CM | POA: Insufficient documentation

## 2013-02-19 DIAGNOSIS — R51 Headache: Secondary | ICD-10-CM | POA: Insufficient documentation

## 2013-02-19 DIAGNOSIS — Z8659 Personal history of other mental and behavioral disorders: Secondary | ICD-10-CM | POA: Insufficient documentation

## 2013-02-19 DIAGNOSIS — Z862 Personal history of diseases of the blood and blood-forming organs and certain disorders involving the immune mechanism: Secondary | ICD-10-CM | POA: Insufficient documentation

## 2013-02-19 DIAGNOSIS — J020 Streptococcal pharyngitis: Secondary | ICD-10-CM | POA: Insufficient documentation

## 2013-02-19 MED ORDER — DEXAMETHASONE SODIUM PHOSPHATE 4 MG/ML IJ SOLN
8.0000 mg | Freq: Once | INTRAMUSCULAR | Status: AC
  Administered 2013-02-19: 8 mg via INTRAMUSCULAR
  Filled 2013-02-19: qty 2

## 2013-02-19 MED ORDER — PENICILLIN G BENZATHINE 1200000 UNIT/2ML IM SUSP
1.2000 10*6.[IU] | Freq: Once | INTRAMUSCULAR | Status: AC
  Administered 2013-02-19: 1.2 10*6.[IU] via INTRAMUSCULAR
  Filled 2013-02-19: qty 2

## 2013-02-19 NOTE — ED Provider Notes (Signed)
CSN: [id number]     Arrival date & time 02/19/13  1240 History   First MD Initiated Contact with Patient 02/19/13 1346     Chief Complaint  Patient presents with  . Sore Throat   (Consider location/radiation/quality/duration/timing/severity/associated sxs/prior Treatment) HPI Comments: Patient is otherwise healthy 28 year old female who presents to the ED with a several day history of sore throat, fever, headache, myalgias, swollen glands.  She states that she looked in the back of her throat and noticed white pustules.  She denies inability to open her mouth or swallow.  Patient is a 28 y.o. female presenting with pharyngitis. The history is provided by the patient. No language interpreter was used.  Sore Throat This is a new problem. The current episode started in the past 7 days. The problem occurs constantly. The problem has been unchanged. Associated symptoms include congestion, a fever, headaches, myalgias, a sore throat and swollen glands. Pertinent negatives include no abdominal pain, chest pain, chills, coughing, nausea, neck pain, rash, vomiting or weakness. The symptoms are aggravated by swallowing. She has tried nothing for the symptoms. The treatment provided no relief.    Past Medical History  Diagnosis Date  . Anemia   . ADD (attention deficit disorder) without hyperactivity    Past Surgical History  Procedure Laterality Date  . Diagnostic laparoscopy      tacked pelvic ligaments due to prolapse and removed iud  . Cesarean section    . Uterine suspension    . Iud removal     Family History  Problem Relation Age of Onset  . Anesthesia problems Neg Hx   . Hypotension Neg Hx   . Malignant hyperthermia Neg Hx   . Pseudochol deficiency Neg Hx   . Stroke Paternal Grandfather     also had ms  . Cancer Maternal Grandmother     brain  . Diabetes Maternal Uncle    History  Substance Use Topics  . Smoking status: Current Every Day Smoker -- 1.00 packs/day for 2 years   Types: Cigarettes  . Smokeless tobacco: Never Used  . Alcohol Use: 0.0 oz/week     Comment: occasional drinks alcohol   OB History   Grav Para Term Preterm Abortions TAB SAB Ect Mult Living   2 2 2  0 0 0 0 0 0 2     Review of Systems  Constitutional: Positive for fever. Negative for chills.  HENT: Positive for congestion and sore throat.   Respiratory: Negative for cough.   Cardiovascular: Negative for chest pain.  Gastrointestinal: Negative for nausea, vomiting and abdominal pain.  Musculoskeletal: Positive for myalgias. Negative for neck pain.  Skin: Negative for rash.  Neurological: Positive for headaches. Negative for weakness.  All other systems reviewed and are negative.    Allergies  Review of patient's allergies indicates no known allergies.  Home Medications  No current outpatient prescriptions on file. BP 128/86  Pulse 75  Temp(Src) 97.9 F (36.6 C) (Oral)  Resp 16  Ht 5' 9.5" (1.765 m)  Wt 190 lb (86.183 kg)  BMI 27.67 kg/m2  SpO2 98%  LMP 02/17/2013  Breastfeeding? No Physical Exam  Nursing note and vitals reviewed. Constitutional: She is oriented to person, place, and time. She appears well-developed and well-nourished. No distress.  HENT:  Head: Normocephalic and atraumatic.  Right Ear: External ear normal.  Left Ear: External ear normal.  Nose: Nose normal.  Mouth/Throat: Oropharyngeal exudate present.  Eyes: Conjunctivae are normal. Pupils are equal, round, and  reactive to light. No scleral icterus.  Neck: Normal range of motion. Neck supple.  Bilateral anterior cervical adenopathy  Cardiovascular: Normal rate, regular rhythm and normal heart sounds.  Exam reveals no gallop and no friction rub.   No murmur heard. Pulmonary/Chest: Effort normal and breath sounds normal. No respiratory distress. She has no wheezes. She has no rales. She exhibits no tenderness.  Abdominal: Soft. Bowel sounds are normal. She exhibits no distension. There is no  tenderness. There is no rebound and no guarding.  Musculoskeletal: Normal range of motion. She exhibits no edema and no tenderness.  Lymphadenopathy:    She has cervical adenopathy.  Neurological: She is alert and oriented to person, place, and time. She exhibits normal muscle tone. Coordination normal.  Skin: Skin is warm and dry. No rash noted. No erythema. No pallor.  Psychiatric: She has a normal mood and affect. Her behavior is normal. Judgment and thought content normal.    ED Course  Procedures (including critical care time) Labs Review Labs Reviewed - No data to display Imaging Review No results found.  EKG Interpretation   None      Medications  dexamethasone (DECADRON) injection 8 mg (8 mg Intramuscular Given 02/19/13 1432)  penicillin g benzathine (BICILLIN LA) 1200000 UNIT/2ML injection 1.2 Million Units (1.2 Million Units Intramuscular Given 02/19/13 1431)     MDM  Strep pharyngitis  CENTOR score of 4/4, presumptive for strep - no evidence to suggest PTA, ludwigs angina.  Treated here.   Izola PriceFrances C. Marisue HumbleSanford, PA-C 02/19/13 1445

## 2013-02-19 NOTE — ED Provider Notes (Signed)
Medical screening examination/treatment/procedure(s) were performed by non-physician practitioner and as supervising physician I was immediately available for consultation/collaboration.  EKG Interpretation   None         Denaya Horn L Makita Blow, MD 02/19/13 1609 

## 2013-02-19 NOTE — ED Notes (Signed)
Pt began having sore throat yesterday morning, worsening today. Salt water gargles not relieving, pain worsening with coughing and swallowing. White patches noted on tonsils bil.

## 2013-02-19 NOTE — Discharge Instructions (Signed)
Strep Throat  Strep throat is an infection of the throat caused by a bacteria named Streptococcus pyogenes. Your caregiver may call the infection streptococcal "tonsillitis" or "pharyngitis" depending on whether there are signs of inflammation in the tonsils or back of the throat. Strep throat is most common in children aged 28 15 years during the cold months of the year, but it can occur in people of any age during any season. This infection is spread from person to person (contagious) through coughing, sneezing, or other close contact.  SYMPTOMS   · Fever or chills.  · Painful, swollen, red tonsils or throat.  · Pain or difficulty when swallowing.  · White or yellow spots on the tonsils or throat.  · Swollen, tender lymph nodes or "glands" of the neck or under the jaw.  · Red rash all over the body (rare).  DIAGNOSIS   Many different infections can cause the same symptoms. A test must be done to confirm the diagnosis so the right treatment can be given. A "rapid strep test" can help your caregiver make the diagnosis in a few minutes. If this test is not available, a light swab of the infected area can be used for a throat culture test. If a throat culture test is done, results are usually available in a day or two.  TREATMENT   Strep throat is treated with antibiotic medicine.  HOME CARE INSTRUCTIONS   · Gargle with 1 tsp of salt in 1 cup of warm water, 3 4 times per day or as needed for comfort.  · Family members who also have a sore throat or fever should be tested for strep throat and treated with antibiotics if they have the strep infection.  · Make sure everyone in your household washes their hands well.  · Do not share food, drinking cups, or personal items that could cause the infection to spread to others.  · You may need to eat a soft food diet until your sore throat gets better.  · Drink enough water and fluids to keep your urine clear or pale yellow. This will help prevent dehydration.  · Get plenty of  rest.  · Stay home from school, daycare, or work until you have been on antibiotics for 24 hours.  · Only take over-the-counter or prescription medicines for pain, discomfort, or fever as directed by your caregiver.  · If antibiotics are prescribed, take them as directed. Finish them even if you start to feel better.  SEEK MEDICAL CARE IF:   · The glands in your neck continue to enlarge.  · You develop a rash, cough, or earache.  · You cough up green, yellow-brown, or bloody sputum.  · You have pain or discomfort not controlled by medicines.  · Your problems seem to be getting worse rather than better.  SEEK IMMEDIATE MEDICAL CARE IF:   · You develop any new symptoms such as vomiting, severe headache, stiff or painful neck, chest pain, shortness of breath, or trouble swallowing.  · You develop severe throat pain, drooling, or changes in your voice.  · You develop swelling of the neck, or the skin on the neck becomes red and tender.  · You have a fever.  · You develop signs of dehydration, such as fatigue, dry mouth, and decreased urination.  · You become increasingly sleepy, or you cannot wake up completely.  Document Released: 01/25/2000 Document Revised: 01/14/2012 Document Reviewed: 03/28/2010  [employer]® Patient Information ©2014 [employer].

## 2013-03-22 ENCOUNTER — Encounter (INDEPENDENT_AMBULATORY_CARE_PROVIDER_SITE_OTHER): Payer: Self-pay

## 2013-03-22 ENCOUNTER — Encounter: Payer: Self-pay | Admitting: Obstetrics and Gynecology

## 2013-03-22 ENCOUNTER — Other Ambulatory Visit (HOSPITAL_COMMUNITY)
Admission: RE | Admit: 2013-03-22 | Discharge: 2013-03-22 | Disposition: A | Discharge status: Home / Self Care | Payer: Medicaid Other | Source: Ambulatory Visit | Attending: Obstetrics and Gynecology | Admitting: Obstetrics and Gynecology

## 2013-03-22 ENCOUNTER — Ambulatory Visit (INDEPENDENT_AMBULATORY_CARE_PROVIDER_SITE_OTHER): Payer: Medicaid Other | Admitting: Obstetrics and Gynecology

## 2013-03-22 VITALS — BP 124/72 | Ht 68.0 in | Wt 195.8 lb

## 2013-03-22 DIAGNOSIS — N906 Unspecified hypertrophy of vulva: Secondary | ICD-10-CM | POA: Insufficient documentation

## 2013-03-22 DIAGNOSIS — Z309 Encounter for contraceptive management, unspecified: Secondary | ICD-10-CM

## 2013-03-22 DIAGNOSIS — Z01419 Encounter for gynecological examination (general) (routine) without abnormal findings: Secondary | ICD-10-CM | POA: Insufficient documentation

## 2013-03-22 DIAGNOSIS — Z0000 Encounter for general adult medical examination without abnormal findings: Secondary | ICD-10-CM

## 2013-03-22 DIAGNOSIS — K429 Umbilical hernia without obstruction or gangrene: Secondary | ICD-10-CM

## 2013-03-22 NOTE — Progress Notes (Signed)
Patient ID: Allison Allison Miles, female   DOB: 03-01-1985, 28 y.o.   MRN: [id number]  Assessment:  Annual Gyn Exam Hx LEEP for abnl pap with - followup Labial hypertrophy, symptomatic. UMbilical hernia,symptomatic.   Plan:  1. pap smear done, next pap due annually due to hx of leep 2. return annually or prn 3. Pt to see Dr Malvin JohnsBradford re: umbilical hernia 3    I can do reuction labia surgery at same time as umbilical hernia. Subjective:  Allison Allison Miles is a [age] y.o. female G2P2002 who presents for annual exam. No LMP recorded. Patient has had an implant. The patient has complaints today of  Coital Labial pain due to progressive hypertrophy of L minora.  The following portions of the patient's history were reviewed and updated as appropriate: allergies, current medications, past family history, past medical history, past social history, past surgical history and problem Allison Miles.  Review of Systems Constitutional:  Gastrointestinal: positive for abdominal pain and noted at the time of fullness, at site of umbilical hernia. Genitourinary: labial hypertrophy anterior 1/2 of L minora is elongated, sufficient to be tugged and cause pain with coitus.  Objective:  BP 124/72  Ht 5\' 8"  (1.727 m)  Wt 195 lb 12.8 oz (88.814 kg)  BMI 29.78 kg/m2   BMI: Body mass index is 29.78 kg/(m^2).  General Appearance: Alert, appropriate appearance for age. No acute distress HEENT: Grossly normal Neck / Thyroid:  Cardiovascular: RRR; normal S1, S2, no murmur Lungs: CTA bilaterally Back: No CVAT Breast Exam: No dimpling, nipple retraction or discharge. No masses or nodes., Normal breast tissue bilaterally and No masses or nodes.No dimpling, nipple retraction or discharge. Gastrointestinal: Soft, non-tender, no masses or organomegaly Pelvic Exam: External genitalia: labia minora elongate over anterior half of l.minora Vaginal: normal mucosa without prolapse or lesions Cervix: ectropian, with eversion of  glandular tissue throulgh current endocervical canal and . Adnexa: normal bimanual exam Uterus: anteverted Clinical staff offered to be present for exam: yes  Initials: chr pulliam Rectovaginal: not indicated Lymphatic Exam: Non-palpable nodes in neck, clavicular, axillary, or inguinal regions Skin: no rash or abnormalities Neurologic: Normal gait and speech, no tremor  Psychiatric: Alert and oriented, appropriate affect.  Urinalysis:Not done  Christin BachJohn Hetal Proano. MD Pgr [phone] 5:15 PM

## 2013-03-22 NOTE — Patient Instructions (Signed)
See Dr Malvin Johnsbradford  re your umbilical hernia [phone]

## 2013-05-03 ENCOUNTER — Encounter (HOSPITAL_COMMUNITY): Payer: Self-pay | Admitting: Pharmacy Technician

## 2013-05-03 NOTE — Consult Note (Signed)
NAME:  Allison Miles, Allison Miles                  ACCOUNT NO.:  MEDICAL RECORD NO.:  [id number]  LOCATION:                                 FACILITY:  PHYSICIAN:  Barbaraann Barthel, M.D. DATE OF BIRTH:  August 23, 1985  DATE OF CONSULTATION: DATE OF DISCHARGE:                                CONSULTATION   HISTORY OF PRESENT ILLNESS:  Surgery was asked to see this 28 year old white female with an umbilical hernia that has been present for the last year.  She was seen by Dr. Emelda Fear and referred to my office.  PAST SURGICAL HISTORY:  Past surgery has included the removal of an IUD in 2011.  She also had a laparoscopic surgery on her uterus in 2013. She had a C-section in 2010 and she had an implantation of a new birth control device in her left arm.  DRUG ALLERGIES:  She has no known allergies.  SOCIAL HISTORY:  She does smoke about a pack of cigarettes per day.  No history of alcohol abuse.  MEDICATIONS:  No medications on a regular basis.  PHYSICAL EXAMINATION:  VITAL SIGNS:  She is 5 feet 10 inches, weighs 193 pounds.  Temperature is 97.3, pulse is 60, respirations 12, blood pressure 100/70. HEENT:  Head is normocephalic.  Eyes, extraocular movements are intact. Pupils are round and reactive to light and accommodation.  There is no conjunctival pallor or scleral injection.  The sclerae has a normal tincture.  Extraocular movements are intact.  No bruits are appreciated. No adenopathy.  No adenopathy, no thyromegaly, and no bruits are appreciated.  No jugular vein distention. CHEST:  Clear to both anterior and posterior auscultation. HEART:  Regular rhythm. BREASTS:  Breasts and axillae are negative. ABDOMEN:  Soft.  The patient has an umbilical hernia, I would suspect about the size of a 50 cent piece.  No groin hernias are appreciated. RECTAL:  Deferred. EXTREMITIES:  Within normal limits.  REVIEW OF SYSTEMS:  NEUROLOGIC:  No history of migraines or seizures or any lateralizing  neurological findings.  ENDOCRINE:  No history of diabetes, thyroid disease, or adrenal problems.  CARDIOPULMONARY:  The patient smokes pack of cigarettes per day.  MUSCULOSKELETAL:  Grossly within normal limits.  OB/GYN HISTORY:  She is a gravida 2, para 1, cesarean 1 female with no family history of breast carcinoma and as stated, she has had some laparoscopic uterine surgery.  She also has birth control device implanted.  GI:  No history of hepatitis.  She does suffer from recurrent constipation.  No history of bright red rectal bleeding, black tarry stools, or history of inflammatory bowel disease, or irritable bowel syndrome.  No unexplained weight loss.  She has never had a colonoscopy.  GU:  No history of frequency, dysuria, or kidney stones.  OCCUPATIONAL HISTORY:  She is a Associate Professor.  REVIEW OF HISTORY AND PHYSICAL:  Therefore, Allison Miles is a 28 year old white female who has an umbilical hernia.  We will plan to repair this in conjunction with Dr. Rayna Sexton surgery on her labia.  We will plan to do this in concert with him.  We discussed the surgery from a general surgical point of view  and discussed complications, not limited to, but including bleeding, infection, and recurrence, and the possibility that mesh prosthesis might be required.  Informed consent was obtained.  We will coordinate surgery with Dr. Emelda FearFerguson, and we thank him for the consultation.     Barbaraann BarthelWilliam Jaliah Foody, M.D.     WB/MEDQ  D:  05/02/2013  T:  05/03/2013  Job:  [id number]

## 2013-05-09 ENCOUNTER — Ambulatory Visit (INDEPENDENT_AMBULATORY_CARE_PROVIDER_SITE_OTHER): Payer: Medicaid Other | Admitting: Obstetrics and Gynecology

## 2013-05-09 ENCOUNTER — Encounter: Payer: Self-pay | Admitting: Obstetrics and Gynecology

## 2013-05-09 VITALS — BP 122/62 | Ht 70.0 in | Wt 197.6 lb

## 2013-05-09 DIAGNOSIS — N906 Unspecified hypertrophy of vulva: Secondary | ICD-10-CM

## 2013-05-09 DIAGNOSIS — Z01818 Encounter for other preprocedural examination: Secondary | ICD-10-CM

## 2013-05-09 NOTE — Patient Instructions (Signed)
Keep labarotory appt Tuesday at hospital as directed by Dr Malvin JohnsBradford.

## 2013-05-09 NOTE — Patient Instructions (Addendum)
Allison Miles  05/10/2013   Your procedure is scheduled on:  05/12/2013  Report to [facility] at 615  AM.  Call this number if you have problems the morning of surgery: [phone]   Remember:   Do not eat food or drink liquids after midnight.   Take these medicines the morning of surgery with A SIP OF WATER: none   Do not wear jewelry, make-up or nail polish.  Do not wear lotions, powders, or perfumes.  Do not shave 48 hours prior to surgery. Men may shave face and neck.  Do not bring valuables to the hospital.  [facility] is not responsible for any belongings or valuables.               Contacts, dentures or bridgework may not be worn into surgery.  Leave suitcase in the car. After surgery it may be brought to your room.  For patients admitted to the hospital, discharge time is determined by your treatment team.               Patients discharged the day of surgery will not be allowed to drive home.  Name and phone number of your driver: family  Special Instructions: Shower using CHG 2 nights before surgery and the night before surgery.  If you shower the day of surgery use CHG.  Use special wash - you have one bottle of CHG for all showers.  You should use approximately 1/3 of the bottle for each shower.   Please read over the following fact sheets that you were given: Pain Booklet, Coughing and Deep Breathing, Surgical Site Infection Prevention, Anesthesia Post-op Instructions and Care and Recovery After Surgery Hernia A hernia occurs when an internal organ pushes out through a weak spot in the abdominal wall. Hernias most commonly occur in the groin and around the navel. Hernias often can be pushed back into place (reduced). Most hernias tend to get worse over time. Some abdominal hernias can get stuck in the opening (irreducible or incarcerated hernia) and cannot be reduced. An irreducible abdominal hernia which is tightly squeezed into the opening is at risk for impaired  blood supply (strangulated hernia). A strangulated hernia is a medical emergency. Because of the risk for an irreducible or strangulated hernia, surgery may be recommended to repair a hernia. CAUSES   Heavy lifting.  Prolonged coughing.  Straining to have a bowel movement.  A cut (incision) made during an abdominal surgery. HOME CARE INSTRUCTIONS   Bed rest is not required. You may continue your normal activities.  Avoid lifting more than 10 pounds (4.5 kg) or straining.  Cough gently. If you are a smoker it is best to stop. Even the best hernia repair can break down with the continual strain of coughing. Even if you do not have your hernia repaired, a cough will continue to aggravate the problem.  Do not wear anything tight over your hernia. Do not try to keep it in with an outside bandage or truss. These can damage abdominal contents if they are trapped within the hernia sac.  Eat a normal diet.  Avoid constipation. Straining over long periods of time will increase hernia size and encourage breakdown of repairs. If you cannot do this with diet alone, stool softeners may be used. SEEK IMMEDIATE MEDICAL CARE IF:   You have a fever.  You develop increasing abdominal pain.  You feel nauseous or vomit.  Your hernia is stuck outside the abdomen, looks  discolored, feels hard, or is tender.  You have any changes in your bowel habits or in the hernia that are unusual for you.  You have increased pain or swelling around the hernia.  You cannot push the hernia back in place by applying gentle pressure while lying down. MAKE SURE YOU:   Understand these instructions.  Will watch your condition.  Will get help right away if you are not doing well or get worse. Document Released: 01/27/2005 Document Revised: 04/21/2011 Document Reviewed: 09/16/2007 [facility] Patient Information 2014 [place], [place]. Hernia Repair Care After These instructions give you information on caring for  yourself after your procedure. Your doctor may also give you more specific instructions. Call your doctor if you have any problems or questions after your procedure. HOME CARE   You may have changes in your poops (bowel movements).  You may have loose or watery poop (diarrhea).  You may be not able to poop.  Your bowels will slowly get back to normal.  Do not eat any food that makes you sick to your stomach (nauseous). Eat small meals 4 to 6 times a day instead of 3 large ones.  Do not drink pop. It will give you gas.  Do not drink alcohol.  Do not lift anything heavier than 10 pounds. This is about the weight of a gallon of milk.  Do not do anything that makes you very tired for at least 6 weeks.  Do not get your wound wet for 2 days.  You may take a sponge bath during this time.  After 2 days you may take a shower. Gently pat your surgical cut (incision) dry with a towel. Do not rub it.  For men: You may have been given an athletic supporter (scrotal support) before you left the hospital. It holds your scrotum and testicles closer to your body so there is no strain on your wound. Wear the supporter until your doctor tells you that you do not need it anymore. GET HELP RIGHT AWAY IF:  You have watery poop, or cannot poop for more than 3 days.  You feel sick to your stomach or throw up (vomit) more than 2 or 3 times.  You have temperature by mouth above 102 F (38.9 C).  You see redness or puffiness (swelling) around your wound.  You see yellowish white fluid (pus) coming from your wound.  You see a bulge or bump in your lower belly (abdomen) or near your groin.  You develop a rash, trouble breathing, or any other symptoms from medicines taken. MAKE SURE YOU:  Understand these instructions.  Will watch your condition.  Will get help right away if your are not doing well or get worse. Document Released: 01/10/2008 Document Revised: 04/21/2011 Document Reviewed:  01/10/2008 [facility] Patient Information 2014 [place], [place]. General Anesthesia, Adult, Care After Refer to this sheet in the next few weeks. These instructions provide you with information on caring for yourself after your procedure. Your health care provider may also give you more specific instructions. Your treatment has been planned according to current medical practices, but problems sometimes occur. Call your health care provider if you have any problems or questions after your procedure. WHAT TO EXPECT AFTER THE PROCEDURE After the procedure, it is typical to experience:  Sleepiness.  Nausea and vomiting. HOME CARE INSTRUCTIONS  For the first 24 hours after general anesthesia:  Have a responsible person with you.  Do not drive a car. If you are alone, do not  take public transportation.  Do not drink alcohol.  Do not take medicine that has not been prescribed by your health care provider.  Do not sign important papers or make important decisions.  You may resume a normal diet and activities as directed by your health care provider.  Change bandages (dressings) as directed.  If you have questions or problems that seem related to general anesthesia, call the hospital and ask for the anesthetist or anesthesiologist on call. SEEK MEDICAL CARE IF:  You have nausea and vomiting that continue the day after anesthesia.  You develop a rash. SEEK IMMEDIATE MEDICAL CARE IF:   You have difficulty breathing.  You have chest pain.  You have any allergic problems. Document Released: 05/05/2000 Document Revised: 09/29/2012 Document Reviewed: 08/12/2012 [facility] Patient Information 2014 [place], [place]. General Anesthesia, Adult General anesthesia is a sleep-like state of non-feeling produced by medicines (anesthetics). General anesthesia prevents you from being alert and feeling pain during a medical procedure. Your caregiver may recommend general anesthesia if your  procedure:  Is long.  Is painful or uncomfortable.  Would be frightening to see or hear.  Requires you to be still.  Affects your breathing.  Causes significant blood loss. LET YOUR CAREGIVER KNOW ABOUT:  Allergies to food or medicine.  Medicines taken, including vitamins, herbs, eyedrops, over-the-counter medicines, and creams.  Use of steroids (by mouth or creams).  Previous problems with anesthetics or numbing medicines, including problems experienced by relatives.  History of bleeding problems or blood clots.  Previous surgeries and types of anesthetics received.  Possibility of pregnancy, if this applies.  Use of cigarettes, alcohol, or illegal drugs.  Any health condition(s), especially diabetes, sleep apnea, and high blood pressure. RISKS AND COMPLICATIONS General anesthesia rarely causes complications. However, if complications do occur, they can be life-threatening. Complications include:  A lung infection.  A stroke.  A heart attack.  Waking up during the procedure. When this occurs, the patient may be unable to move and communicate that he or she is awake. The patient may feel severe pain. Older adults and adults with serious medical problems are more likely to have complications than adults who are young and healthy. Some complications can be prevented by answering all of your caregiver's questions thoroughly and by following all pre-procedure instructions. It is important to tell your caregiver if any of the pre-procedure instructions, especially those related to diet, were not followed. Any food or liquid in the stomach can cause problems when you are under general anesthesia. BEFORE THE PROCEDURE  Ask your caregiver if you will have to spend the night at the hospital. If you will not have to spend the night, arrange to have an adult drive you and stay with you for 24-hours.  Follow your caregiver's instructions if you are taking dietary supplements or  medicines. Your caregiver may tell you to stop taking them or to reduce your dosage.  Do not smoke for as long as possible before your procedure. If possible, stop smoking 3 6 weeks before the procedure.  Do not take new dietary supplements or medicines within 1 week of your procedure unless your caregiver approves them.  Do not eat within 8 hours of your procedure or as directed by your caregiver. Drink only clear liquids, such as water, black coffee (without milk or cream), and fruit juices (without pulp).  Do not drink within 3 hours of your procedure or as directed by your caregiver.  You may brush your teeth on the morning  of the procedure, but make sure to spit out the toothpaste and water when finished. PROCEDURE  You will receive anesthetics through a mask, through an intravenous (IV) access tube, or through both. A doctor who specializes in anesthesia (anesthesiologist) or a nurse who specializes in anesthesia (nurse anesthetist) or both will stay with you throughout the procedure to make sure you remain unconscious. He or she will also watch your blood pressure, pulse, and oxygen levels to make sure that the anesthetics do not cause any problems. Once you are asleep, a breathing tube or mask may be used to help you breathe. AFTER THE PROCEDURE You will wake up after the procedure is complete. You may be in the room where the procedure was performed or in a recovery area. You may have a sore throat if a breathing tube was used. You may also feel:  Dizzy.  Weak.  Drowsy.  Confused.  Nauseous.  Cold. These are all normal responses and can be expected to last for up to 24 hours after the procedure is complete. A caregiver will tell you when you are ready to go home. This will usually be when you are fully awake and in stable condition. Document Released: 05/06/2007 Document Revised: 01/14/2012 Document Reviewed: 05/28/2011 [facility] Patient Information 2014 [place].

## 2013-05-09 NOTE — Progress Notes (Signed)
This chart was scribed by Leone Payor, Medical Scribe, for Dr. Christin Bach on 05/09/13 at 2:14 PM. This chart was reviewed by Dr. Christin Bach and is accurate.    Preoperative History and Physical  Allison Miles is a 28 y.o. [id number] here for surgical management of bilateral labia minora reduction and hernia repair which will be done with Dr. Malvin Johns. She has history of labial hypertrophy and is having surgery as a result of this.   Proposed surgery: Vaginal labial reduction in conjunction with a hernia repair by Dr. Malvin Johns.   Past Medical History  Diagnosis Date  . Anemia   . ADD (attention deficit disorder) without hyperactivity    Past Surgical History  Procedure Laterality Date  . Diagnostic laparoscopy      tacked pelvic ligaments due to prolapse and removed iud  . Cesarean section    . Uterine suspension    . Iud removal     OB History   Grav Para Term Preterm Abortions TAB SAB Ect Mult Living   2 2 2  0 0 0 0 0 0 2     Patient denies any cervical dysplasia or STIs.  (Not in a hospital admission)  No Known Allergies Social History:   reports that she has been smoking Cigarettes.  She has a 2 pack-year smoking history. She has never used smokeless tobacco. She reports that she drinks alcohol. She reports that she does not use illicit drugs. Family History  Problem Relation Age of Onset  . Anesthesia problems Neg Hx   . Hypotension Neg Hx   . Malignant hyperthermia Neg Hx   . Pseudochol deficiency Neg Hx   . Stroke Paternal Grandfather     also had ms  . Cancer Maternal Grandmother     brain  . Diabetes Maternal Uncle   . Cancer Mother     oral and cervical    Review of Systems: Noncontributory, she denies cough, congestion, rhinorrhea, fever.   PHYSICAL EXAM: Blood pressure 122/62, height 5\' 10"  (1.778 m), weight 197 lb 9.6 oz (89.631 kg). General appearance - alert, well appearing, and in no distress Chest - clear to auscultation, no wheezes, rales or  rhonchi, symmetric air entry Heart - normal rate and regular rhythm Abdomen - soft, nontender, nondistended, no masses or organomegaly, hernia present without changes per Dr. Malvin Johns.  Pelvic - examination not indicated Extremities - peripheral pulses normal, no pedal edema, no clubbing or cyanosis  Labs: No results found for this or any previous visit (from the past 336 hour(s)).  Imaging Studies: No results found.  Assessment: Patient Active Problem List   Diagnosis Date Noted  . Hernia, umbilical 03/22/2013  . 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 Contraceptive management 03/22/2013    Plan: Patient will undergo surgical management with labioplasty.   The risks of surgery were discussed in detail with the patient including but not limited to: bleeding which may require transfusion or reoperation; infection which may require antibiotics; injury to surrounding tissues, thromboembolic phenomenon, surgical site problems and other postoperative/anesthesia complications. Likelihood of success in alleviating the patient's condition was discussed. Routine postoperative instructions will be reviewed with the patient and her family in detail after surgery.  The patient concurred with the proposed plan, giving informed written consent for the surgery.  Patient has been NPO since last night she will remain NPO for procedure.  Anesthesia and OR aware.  Preoperative prophylactic antibiotics and SCDs ordered on call to the OR.  To OR when ready.  Tilda BurrowJohn V. Meliza Kage, MD  05/09/2013 2:13 PM

## 2013-05-10 ENCOUNTER — Encounter (HOSPITAL_COMMUNITY)
Admission: RE | Admit: 2013-05-10 | Discharge: 2013-05-10 | Disposition: A | Discharge status: Home / Self Care | Payer: Medicaid Other | Source: Ambulatory Visit | Attending: General Surgery | Admitting: General Surgery

## 2013-05-10 ENCOUNTER — Encounter (HOSPITAL_COMMUNITY): Payer: Self-pay

## 2013-05-10 LAB — CBC WITH DIFFERENTIAL/PLATELET
BASOS ABS: 0 10*3/uL (ref 0.0–0.1)
Basophils Relative: 0 % (ref 0–1)
Eosinophils Absolute: 0.1 10*3/uL (ref 0.0–0.7)
Eosinophils Relative: 1 % (ref 0–5)
HCT: 42 % (ref 36.0–46.0)
Hemoglobin: 14.2 g/dL (ref 12.0–15.0)
LYMPHS ABS: 2.3 10*3/uL (ref 0.7–4.0)
LYMPHS PCT: 27 % (ref 12–46)
MCH: 31.1 pg (ref 26.0–34.0)
MCHC: 33.8 g/dL (ref 30.0–36.0)
MCV: 91.9 fL (ref 78.0–100.0)
Monocytes Absolute: 0.6 10*3/uL (ref 0.1–1.0)
Monocytes Relative: 7 % (ref 3–12)
NEUTROS ABS: 5.6 10*3/uL (ref 1.7–7.7)
Neutrophils Relative %: 65 % (ref 43–77)
PLATELETS: 250 10*3/uL (ref 150–400)
RBC: 4.57 MIL/uL (ref 3.87–5.11)
RDW: 13.5 % (ref 11.5–15.5)
WBC: 8.6 10*3/uL (ref 4.0–10.5)

## 2013-05-10 LAB — BASIC METABOLIC PANEL
BUN: 12 mg/dL (ref 6–23)
CHLORIDE: 102 meq/L (ref 96–112)
CO2: 24 meq/L (ref 19–32)
Calcium: 9.8 mg/dL (ref 8.4–10.5)
Creatinine, Ser: 0.77 mg/dL (ref 0.50–1.10)
GFR calc Af Amer: >90 mL/min (ref >90)
GFR calc non Af Amer: >90 mL/min (ref >90)
Glucose, Bld: 122 mg/dL — ABNORMAL HIGH (ref 70–99)
POTASSIUM: 3.9 meq/L (ref 3.7–5.3)
SODIUM: 139 meq/L (ref 137–147)

## 2013-05-10 LAB — HCG, SERUM, QUALITATIVE: PREG SERUM: NEGATIVE

## 2013-05-10 NOTE — Pre-Procedure Instructions (Signed)
Patient given information to sign up for my chart at home. 

## 2013-05-12 ENCOUNTER — Ambulatory Visit (HOSPITAL_COMMUNITY): Payer: Medicaid Other | Admitting: Anesthesiology

## 2013-05-12 ENCOUNTER — Encounter (HOSPITAL_COMMUNITY): Payer: Self-pay | Admitting: *Deleted

## 2013-05-12 ENCOUNTER — Encounter (HOSPITAL_COMMUNITY): Payer: Medicaid Other | Admitting: Anesthesiology

## 2013-05-12 ENCOUNTER — Other Ambulatory Visit: Payer: Self-pay | Admitting: Obstetrics and Gynecology

## 2013-05-12 ENCOUNTER — Ambulatory Visit (HOSPITAL_COMMUNITY)
Admission: RE | Admit: 2013-05-12 | Discharge: 2013-05-13 | Disposition: A | Discharge status: Home / Self Care | Payer: Medicaid Other | Source: Ambulatory Visit | Attending: General Surgery | Admitting: General Surgery

## 2013-05-12 ENCOUNTER — Encounter (HOSPITAL_COMMUNITY)
Admission: RE | Disposition: A | Discharge status: Home / Self Care | Payer: Self-pay | Source: Ambulatory Visit | Attending: General Surgery

## 2013-05-12 DIAGNOSIS — F988 Other specified behavioral and emotional disorders with onset usually occurring in childhood and adolescence: Secondary | ICD-10-CM | POA: Insufficient documentation

## 2013-05-12 DIAGNOSIS — N906 Unspecified hypertrophy of vulva: Secondary | ICD-10-CM | POA: Insufficient documentation

## 2013-05-12 DIAGNOSIS — K420 Umbilical hernia with obstruction, without gangrene: Principal | ICD-10-CM | POA: Diagnosis present

## 2013-05-12 DIAGNOSIS — IMO0002 Reserved for concepts with insufficient information to code with codable children: Secondary | ICD-10-CM

## 2013-05-12 DIAGNOSIS — Z01812 Encounter for preprocedural laboratory examination: Secondary | ICD-10-CM | POA: Insufficient documentation

## 2013-05-12 DIAGNOSIS — D649 Anemia, unspecified: Secondary | ICD-10-CM | POA: Insufficient documentation

## 2013-05-12 DIAGNOSIS — Z3040 Encounter for surveillance of contraceptives, unspecified: Secondary | ICD-10-CM

## 2013-05-12 DIAGNOSIS — F17200 Nicotine dependence, unspecified, uncomplicated: Secondary | ICD-10-CM | POA: Insufficient documentation

## 2013-05-12 HISTORY — PX: VULVECTOMY: SHX1086

## 2013-05-12 HISTORY — PX: UMBILICAL HERNIA REPAIR: SHX196

## 2013-05-12 SURGERY — REPAIR, HERNIA, UMBILICAL, ADULT
Anesthesia: General | Site: Abdomen

## 2013-05-12 MED ORDER — FENTANYL CITRATE 0.05 MG/ML IJ SOLN
INTRAMUSCULAR | Status: AC
  Filled 2013-05-12: qty 2

## 2013-05-12 MED ORDER — BACITRACIN-NEOMYCIN-POLYMYXIN 400-5-5000 EX OINT
TOPICAL_OINTMENT | CUTANEOUS | Status: DC | PRN
  Administered 2013-05-12: 2 via TOPICAL

## 2013-05-12 MED ORDER — KETOROLAC TROMETHAMINE 30 MG/ML IJ SOLN
30.0000 mg | Freq: Once | INTRAMUSCULAR | Status: AC
  Administered 2013-05-12: 30 mg via INTRAVENOUS
  Filled 2013-05-12: qty 1

## 2013-05-12 MED ORDER — DOCUSATE SODIUM 100 MG PO CAPS
100.0000 mg | ORAL_CAPSULE | Freq: Every day | ORAL | Status: DC
  Administered 2013-05-12 – 2013-05-13 (×2): 100 mg via ORAL
  Filled 2013-05-12 (×2): qty 1

## 2013-05-12 MED ORDER — BACITRACIN ZINC 500 UNIT/GM EX OINT
TOPICAL_OINTMENT | CUTANEOUS | Status: DC | PRN
  Administered 2013-05-12: 1 via TOPICAL

## 2013-05-12 MED ORDER — BUPIVACAINE-EPINEPHRINE 0.5% -1:200000 IJ SOLN
INTRAMUSCULAR | Status: DC | PRN
  Administered 2013-05-12: 10 mL

## 2013-05-12 MED ORDER — ROCURONIUM BROMIDE 100 MG/10ML IV SOLN
INTRAVENOUS | Status: DC | PRN
  Administered 2013-05-12: 35 mg via INTRAVENOUS
  Administered 2013-05-12: 10 mg via INTRAVENOUS
  Administered 2013-05-12: 5 mg via INTRAVENOUS

## 2013-05-12 MED ORDER — POTASSIUM CHLORIDE IN NACL 20-0.9 MEQ/L-% IV SOLN
INTRAVENOUS | Status: DC
  Administered 2013-05-12: 11:00:00 via INTRAVENOUS

## 2013-05-12 MED ORDER — DEXAMETHASONE SODIUM PHOSPHATE 4 MG/ML IJ SOLN
INTRAMUSCULAR | Status: AC
  Filled 2013-05-12: qty 1

## 2013-05-12 MED ORDER — LIDOCAINE HCL (CARDIAC) 10 MG/ML IV SOLN
INTRAVENOUS | Status: DC | PRN
  Administered 2013-05-12: 10 mg via INTRAVENOUS

## 2013-05-12 MED ORDER — LORAZEPAM 0.5 MG PO TABS: 0.5000 mg | ORAL_TABLET | Freq: Every evening | ORAL | Status: DC | PRN

## 2013-05-12 MED ORDER — NEOSTIGMINE METHYLSULFATE 1 MG/ML IJ SOLN
INTRAMUSCULAR | Status: DC | PRN
  Administered 2013-05-12: 2 mg via INTRAVENOUS

## 2013-05-12 MED ORDER — PROPOFOL 10 MG/ML IV EMUL
INTRAVENOUS | Status: AC
  Filled 2013-05-12: qty 20

## 2013-05-12 MED ORDER — ONDANSETRON HCL 4 MG/2ML IJ SOLN
4.0000 mg | Freq: Once | INTRAMUSCULAR | Status: AC
  Administered 2013-05-12: 4 mg via INTRAVENOUS

## 2013-05-12 MED ORDER — ONDANSETRON HCL 4 MG/2ML IJ SOLN
4.0000 mg | Freq: Once | INTRAMUSCULAR | Status: AC | PRN
  Administered 2013-05-12: 4 mg via INTRAVENOUS
  Filled 2013-05-12: qty 2

## 2013-05-12 MED ORDER — LACTATED RINGERS IV SOLN
INTRAVENOUS | Status: DC
  Administered 2013-05-12 (×2): via INTRAVENOUS

## 2013-05-12 MED ORDER — SODIUM CHLORIDE 0.9 % IJ SOLN
INTRAMUSCULAR | Status: AC
  Filled 2013-05-12: qty 10

## 2013-05-12 MED ORDER — BUPIVACAINE-EPINEPHRINE PF 0.5-1:200000 % IJ SOLN
INTRAMUSCULAR | Status: AC
  Filled 2013-05-12: qty 10

## 2013-05-12 MED ORDER — CEFAZOLIN SODIUM-DEXTROSE 2-3 GM-% IV SOLR
2.0000 g | Freq: Once | INTRAVENOUS | Status: AC
  Administered 2013-05-12: 2 g via INTRAVENOUS

## 2013-05-12 MED ORDER — FENTANYL CITRATE 0.05 MG/ML IJ SOLN
25.0000 ug | INTRAMUSCULAR | Status: DC
  Administered 2013-05-12: 25 ug via INTRAVENOUS
  Filled 2013-05-12: qty 2

## 2013-05-12 MED ORDER — ONDANSETRON HCL 4 MG/2ML IJ SOLN
INTRAMUSCULAR | Status: AC
  Filled 2013-05-12: qty 2

## 2013-05-12 MED ORDER — STERILE WATER FOR IRRIGATION IR SOLN
Status: DC | PRN
  Administered 2013-05-12 (×2): 1000 mL

## 2013-05-12 MED ORDER — MIDAZOLAM HCL 2 MG/2ML IJ SOLN
INTRAMUSCULAR | Status: AC
  Filled 2013-05-12: qty 2

## 2013-05-12 MED ORDER — OXYCODONE-ACETAMINOPHEN 5-325 MG PO TABS
1.0000 | ORAL_TABLET | ORAL | Status: DC | PRN
  Administered 2013-05-12 – 2013-05-13 (×4): 1 via ORAL
  Filled 2013-05-12 (×6): qty 1

## 2013-05-12 MED ORDER — MORPHINE SULFATE 2 MG/ML IJ SOLN
1.0000 mg | INTRAMUSCULAR | Status: DC | PRN
  Administered 2013-05-12: 1 mg via INTRAVENOUS
  Filled 2013-05-12: qty 1

## 2013-05-12 MED ORDER — CEFAZOLIN SODIUM-DEXTROSE 2-3 GM-% IV SOLR
INTRAVENOUS | Status: AC
  Filled 2013-05-12: qty 50

## 2013-05-12 MED ORDER — KETOROLAC TROMETHAMINE 15 MG/ML IJ SOLN
15.0000 mg | Freq: Four times a day (QID) | INTRAMUSCULAR | Status: DC | PRN
  Administered 2013-05-12 – 2013-05-13 (×2): 15 mg via INTRAVENOUS
  Filled 2013-05-12 (×2): qty 1

## 2013-05-12 MED ORDER — MIDAZOLAM HCL 2 MG/2ML IJ SOLN
1.0000 mg | INTRAMUSCULAR | Status: DC | PRN
  Administered 2013-05-12: 2 mg via INTRAVENOUS

## 2013-05-12 MED ORDER — ONDANSETRON HCL 4 MG PO TABS: 4.0000 mg | ORAL_TABLET | Freq: Four times a day (QID) | ORAL | Status: DC | PRN

## 2013-05-12 MED ORDER — PROPOFOL 10 MG/ML IV BOLUS
INTRAVENOUS | Status: DC | PRN
  Administered 2013-05-12: 50 mg via INTRAVENOUS
  Administered 2013-05-12: 150 mg via INTRAVENOUS

## 2013-05-12 MED ORDER — BACITRACIN-NEOMYCIN-POLYMYXIN 400-5-5000 EX OINT
TOPICAL_OINTMENT | CUTANEOUS | Status: AC
  Filled 2013-05-12: qty 2

## 2013-05-12 MED ORDER — FENTANYL CITRATE 0.05 MG/ML IJ SOLN
INTRAMUSCULAR | Status: AC
  Filled 2013-05-12: qty 5

## 2013-05-12 MED ORDER — LIDOCAINE HCL (PF) 1 % IJ SOLN
INTRAMUSCULAR | Status: AC
  Filled 2013-05-12: qty 5

## 2013-05-12 MED ORDER — FENTANYL CITRATE 0.05 MG/ML IJ SOLN
25.0000 ug | INTRAMUSCULAR | Status: DC | PRN
  Administered 2013-05-12 (×3): 50 ug via INTRAVENOUS
  Filled 2013-05-12: qty 2

## 2013-05-12 MED ORDER — DEXAMETHASONE SODIUM PHOSPHATE 4 MG/ML IJ SOLN
4.0000 mg | Freq: Once | INTRAMUSCULAR | Status: AC
  Administered 2013-05-12: 4 mg via INTRAVENOUS

## 2013-05-12 MED ORDER — 0.9 % SODIUM CHLORIDE (POUR BTL) OPTIME
TOPICAL | Status: DC | PRN
  Administered 2013-05-12: 1000 mL

## 2013-05-12 MED ORDER — BACITRACIN ZINC 500 UNIT/GM EX OINT
TOPICAL_OINTMENT | CUTANEOUS | Status: AC
  Filled 2013-05-12: qty 0.9

## 2013-05-12 MED ORDER — BACITRACIN 50000 UNITS IM SOLR
INTRAMUSCULAR | Status: AC
  Filled 2013-05-12: qty 1

## 2013-05-12 MED ORDER — ONDANSETRON HCL 4 MG/2ML IJ SOLN: 4.0000 mg | Freq: Four times a day (QID) | INTRAMUSCULAR | Status: DC | PRN

## 2013-05-12 MED ORDER — GLYCOPYRROLATE 0.2 MG/ML IJ SOLN
INTRAMUSCULAR | Status: DC | PRN
  Administered 2013-05-12: 0.4 mg via INTRAVENOUS

## 2013-05-12 MED ORDER — BUPIVACAINE HCL (PF) 0.5 % IJ SOLN
INTRAMUSCULAR | Status: AC
  Filled 2013-05-12: qty 30

## 2013-05-12 MED ORDER — FENTANYL CITRATE 0.05 MG/ML IJ SOLN
INTRAMUSCULAR | Status: DC | PRN
  Administered 2013-05-12 (×2): 50 ug via INTRAVENOUS
  Administered 2013-05-12: 100 ug via INTRAVENOUS
  Administered 2013-05-12 (×3): 50 ug via INTRAVENOUS

## 2013-05-12 MED ORDER — BUPIVACAINE HCL (PF) 0.5 % IJ SOLN
INTRAMUSCULAR | Status: DC | PRN
  Administered 2013-05-12: 10 mL

## 2013-05-12 MED ORDER — ROCURONIUM BROMIDE 50 MG/5ML IV SOLN
INTRAVENOUS | Status: AC
  Filled 2013-05-12: qty 1

## 2013-05-12 SURGICAL SUPPLY — 60 items
ATTRACTOMAT 16X20 MAGNETIC DRP (DRAPES) ×4 IMPLANT
BAG HAMPER (MISCELLANEOUS) ×4 IMPLANT
BLADE SURG 15 STRL LF DISP TIS (BLADE) ×2 IMPLANT
BLADE SURG 15 STRL SS (BLADE) ×2
CLEANER TIP ELECTROSURG 2X2 (MISCELLANEOUS) ×4 IMPLANT
CLOTH BEACON ORANGE TIMEOUT ST (SAFETY) ×4 IMPLANT
COVER LIGHT HANDLE STERIS (MISCELLANEOUS) ×8 IMPLANT
DECANTER SPIKE VIAL GLASS SM (MISCELLANEOUS) ×4 IMPLANT
DRSG TEGADERM 2-3/8X2-3/4 SM (GAUZE/BANDAGES/DRESSINGS) ×8 IMPLANT
DRSG TEGADERM 4X4.75 (GAUZE/BANDAGES/DRESSINGS) ×4 IMPLANT
ELECT REM PT RETURN 9FT ADLT (ELECTROSURGICAL) ×4
ELECTRODE REM PT RTRN 9FT ADLT (ELECTROSURGICAL) ×2 IMPLANT
FORMALIN 10 PREFIL 120ML (MISCELLANEOUS) ×8 IMPLANT
FORMALIN 10 PREFIL 480ML (MISCELLANEOUS) IMPLANT
GLOVE BIOGEL M 7.0 STRL (GLOVE) ×8 IMPLANT
GLOVE BIOGEL PI IND STRL 7.0 (GLOVE) ×4 IMPLANT
GLOVE BIOGEL PI INDICATOR 7.0 (GLOVE) ×4
GLOVE ECLIPSE 9.0 STRL (GLOVE) ×4 IMPLANT
GLOVE EXAM NITRILE MD LF STRL (GLOVE) ×8 IMPLANT
GLOVE INDICATOR STER SZ 9 (GLOVE) ×4 IMPLANT
GLOVE SKINSENSE NS SZ7.0 (GLOVE) ×2
GLOVE SKINSENSE STRL SZ7.0 (GLOVE) ×2 IMPLANT
GOWN SPEC L3 XXLG W/TWL (GOWN DISPOSABLE) ×4 IMPLANT
GOWN STRL REUS W/TWL LRG LVL3 (GOWN DISPOSABLE) ×8 IMPLANT
INST SET MINOR GENERAL (KITS) ×4 IMPLANT
KIT ROOM TURNOVER AP CYSTO (KITS) IMPLANT
KIT ROOM TURNOVER APOR (KITS) ×4 IMPLANT
MANIFOLD NEPTUNE II (INSTRUMENTS) ×4 IMPLANT
NEEDLE HYPO 25X1 1.5 SAFETY (NEEDLE) IMPLANT
NS IRRIG 1000ML POUR BTL (IV SOLUTION) ×4 IMPLANT
PACK MINOR (CUSTOM PROCEDURE TRAY) ×4 IMPLANT
PACK PERI GYN (CUSTOM PROCEDURE TRAY) IMPLANT
PAD ARMBOARD 7.5X6 YLW CONV (MISCELLANEOUS) ×8 IMPLANT
SET BASIN LINEN APH (SET/KITS/TRAYS/PACK) ×4 IMPLANT
SHEET LAVH (DRAPES) ×4 IMPLANT
SOL PREP PROV IODINE SCRUB 4OZ (MISCELLANEOUS) ×4 IMPLANT
SPONGE GAUZE 4X4 12PLY (GAUZE/BANDAGES/DRESSINGS) ×4 IMPLANT
SPONGE INTESTINAL PEANUT (DISPOSABLE) IMPLANT
SPONGE LAP 18X18 X RAY DECT (DISPOSABLE) ×8 IMPLANT
STAPLER VISISTAT 35W (STAPLE) ×4 IMPLANT
SUT CHROMIC 3 0 SH 27 (SUTURE) IMPLANT
SUT PROLENE 0 CT 1 CR/8 (SUTURE) IMPLANT
SUT PROLENE 1 CT 1 30 (SUTURE) IMPLANT
SUT PROLENE 4 0 PS 2 18 (SUTURE) IMPLANT
SUT PROLENE MO 6 BLUE #0 30IN (SUTURE) ×4 IMPLANT
SUT SILK 2 0 (SUTURE) ×2
SUT SILK 2-0 18XBRD TIE 12 (SUTURE) ×2 IMPLANT
SUT VIC AB 3-0 SH 27 (SUTURE)
SUT VIC AB 3-0 SH 27X BRD (SUTURE) IMPLANT
SUT VIC AB 4-0 SH 27 (SUTURE) ×2
SUT VIC AB 4-0 SH 27XBRD (SUTURE) ×2 IMPLANT
SUT VIC AB 5-0 P-3 18X BRD (SUTURE) IMPLANT
SUT VIC AB 5-0 P3 18 (SUTURE)
SUT VICRYL AB 3 0 TIES (SUTURE) IMPLANT
SUT VICRYL AB 3-0 BRD CT 36IN (SUTURE) IMPLANT
SYR BULB IRRIGATION 50ML (SYRINGE) ×4 IMPLANT
SYR CONTROL 10ML LL (SYRINGE) ×4 IMPLANT
TOWEL OR 17X26 4PK STRL BLUE (TOWEL DISPOSABLE) ×4 IMPLANT
TRAY FOLEY CATH 16FR SILVER (SET/KITS/TRAYS/PACK) ×4 IMPLANT
WATER STERILE IRR 1000ML POUR (IV SOLUTION) ×8 IMPLANT

## 2013-05-12 NOTE — Anesthesia Preprocedure Evaluation (Signed)
Anesthesia Evaluation  Patient identified by MRN, date of birth, ID band Patient awake    Reviewed: Allergy & Precautions, H&P , Patient's Chart, lab work & pertinent test results  History of Anesthesia Complications Negative for: history of anesthetic complications  Airway Mallampati: I TM Distance: >3 FB Neck ROM: Full    Dental  (+) Teeth Intact,    Pulmonary Current Smoker,  smoker   Pulmonary exam normal       Cardiovascular Angina: secondary to ADD meds - resolved. negative cardio ROS  Rhythm:Regular Rate:Normal     Neuro/Psych PSYCHIATRIC DISORDERS (ADD) Pos THC use   GI/Hepatic   Endo/Other    Renal/GU      Musculoskeletal   Abdominal   Peds  Hematology  (+) anemia ,   Anesthesia Other Findings   Reproductive/Obstetrics                           Anesthesia Physical Anesthesia Plan  ASA: II  Anesthesia Plan: General   Post-op Pain Management:    Induction: Intravenous  Airway Management Planned: Oral ETT  Additional Equipment:   Intra-op Plan:   Post-operative Plan: Extubation in OR  Informed Consent: I have reviewed the patients History and Physical, chart, labs and discussed the procedure including the risks, benefits and alternatives for the proposed anesthesia with the patient or authorized representative who has indicated his/her understanding and acceptance.     Plan Discussed with:   Anesthesia Plan Comments:         Anesthesia Quick Evaluation

## 2013-05-12 NOTE — Progress Notes (Signed)
05/12/13 1605 Patient ambulated hallway, approximately 250 feet. Tolerated well. Nursing staff standby assist. Assisted up to chair. Call light within reach. Earnstine RegalAshley Whittany Parish, RN

## 2013-05-12 NOTE — Anesthesia Postprocedure Evaluation (Signed)
Anesthesia Post Note  Patient: Allison Miles  Procedure(s) Performed: Procedure(s) (LRB): UMBILICAL HERNIA REPAIR WITHOUT MESH (N/A) Labial Reduction/Labioplasty (Bilateral)  Anesthesia type: General  Patient location: PACU  Post pain: Pain level controlled  Post assessment: Post-op Vital signs reviewed, Patient's Cardiovascular Status Stable, Respiratory Function Stable, Patent Airway, No signs of Nausea or vomiting and Pain level moderate  Last Vitals:  Filed Vitals:   05/12/13 0924  BP: 121/68  Pulse: 75  Temp: 36.6 C  Resp: 21    Post vital signs: Reviewed and stable  Level of consciousness: awake and alert   Complications: No apparent anesthesia complications

## 2013-05-12 NOTE — Anesthesia Procedure Notes (Signed)
Procedure Name: Intubation Date/Time: 05/12/2013 7:48 AM Performed by: Franco NonesYATES, Lavance Beazer S Pre-anesthesia Checklist: Patient identified, Patient being monitored, Timeout performed, Emergency Drugs available and Suction available Patient Re-evaluated:Patient Re-evaluated prior to inductionOxygen Delivery Method: Circle System Utilized Preoxygenation: Pre-oxygenation with 100% oxygen Intubation Type: IV induction Ventilation: Mask ventilation without difficulty Laryngoscope Size: Miller and 2 Grade View: Grade I Tube type: Oral Tube size: 7.0 mm Number of attempts: 1 Airway Equipment and Method: stylet Placement Confirmation: ETT inserted through vocal cords under direct vision,  positive ETCO2 and breath sounds checked- equal and bilateral Secured at: 21 cm Tube secured with: Tape Dental Injury: Teeth and Oropharynx as per pre-operative assessment

## 2013-05-12 NOTE — Progress Notes (Signed)
Patient requested Percocet for pain, stated she wasn't a fan of the Morphine and was more accustomed to Percocet. Notified MD and prescribed Percocet for pain and Ativan for sleep. The morphine is to be discontinued.

## 2013-05-12 NOTE — Brief Op Note (Signed)
05/12/2013  9:14 AM  PATIENT:  Melony OverlyHeather L Papaleo  28 y.o. female  PRE-OPERATIVE DIAGNOSIS:  umbilical hernia, labial hypertrophy  POST-OPERATIVE DIAGNOSIS:  umbilical hernia, labial hypertrophy  PROCEDURE:  Procedure(s): UMBILICAL HERNIA REPAIR WITHOUT MESH (N/A) Labial Reduction/Labioplasty (Bilateral)  SURGEON:  Surgeon(s) and Role: Panel 1:    * Marlane HatcherWilliam S Bradford, MD - Primary  Panel 2:    * Tilda BurrowJohn V Lugene Hitt, MD - Primary  PHYSICIAN ASSISTANT:   ASSISTANTS: kendrick cst   ANESTHESIA:   local and general  EBL:  Total I/O In: 1400 [I.V.:1400] Out: 75 [Urine:75]  BLOOD ADMINISTERED:none  DRAINS: none   LOCAL MEDICATIONS USED:  MARCAINE    and Amount: 10  Ml in labial tissues  SPECIMEN:  Source of Specimen:  umbilical hernia sac, and redundant labial tissues  DISPOSITION OF SPECIMEN:  PATHOLOGY  COUNTS:  YES  TOURNIQUET:  * No tourniquets in log *  DICTATION: .Dragon Dictation  PLAN OF CARE: Admit for overnight observation for the hernia pain control  PATIENT DISPOSITION:  PACU - hemodynamically stable.   Delay start of Pharmacological VTE agent (>24hrs) due to surgical blood loss or risk of bleeding: not applicable

## 2013-05-12 NOTE — H&P (Signed)
Preoperative History and Physical  Allison Miles is a 28 y.o. [id number] here for surgical management of bilateral labia minora reduction and hernia repair which will be done with Dr. Malvin JohnsBradford. She has history of labial hypertrophy and is having surgery as a result of this.  Proposed surgery: Vaginal labial reduction in conjunction with a hernia repair by Dr. Malvin JohnsBradford.  Past Medical History   Diagnosis  Date   .  Anemia    .  ADD (attention deficit disorder) without hyperactivity     Past Surgical History   Procedure  Laterality  Date   .  Diagnostic laparoscopy       tacked pelvic ligaments due to prolapse and removed iud   .  Cesarean section     .  Uterine suspension     .  Iud removal      OB History    Grav  Para  Term  Preterm  Abortions  TAB  SAB  Ect  Mult  Living    2  2  2   0  0  0  0  0  0  2     Patient denies any cervical dysplasia or STIs.  (Not in a hospital admission)  No Known Allergies  Social History: reports that she has been smoking Cigarettes. She has a 2 pack-year smoking history. She has never used smokeless tobacco. She reports that she drinks alcohol. She reports that she does not use illicit drugs.  Family History   Problem  Relation  Age of Onset   .  Anesthesia problems  Neg Hx    .  Hypotension  Neg Hx    .  Malignant hyperthermia  Neg Hx    .  Pseudochol deficiency  Neg Hx    .  Stroke  Paternal Grandfather      also had ms   .  Cancer  Maternal Grandmother      brain   .  Diabetes  Maternal Uncle    .  Cancer  Mother      oral and cervical   Review of Systems: Noncontributory, she denies cough, congestion, rhinorrhea, fever.  PHYSICAL EXAM:  Blood pressure 122/62, height 5\' 10"  (1.778 m), weight 197 lb 9.6 oz (89.631 kg).  General appearance - alert, well appearing, and in no distress  Chest - clear to auscultation, no wheezes, rales or rhonchi, symmetric air entry  Heart - normal rate and regular rhythm  Abdomen - soft, nontender,  nondistended, no masses or organomegaly, hernia present without changes per Dr. Malvin JohnsBradford.  Pelvic - examination not indicated  Extremities - peripheral pulses normal, no pedal edema, no clubbing or cyanosis  Labs:  No results found for this or any previous visit (from the past 336 hour(s)).  Imaging Studies:  No results found.  Assessment:  Patient Active Problem List    Diagnosis  Date Noted   .  Hernia, umbilical  03/22/2013   .  Labial hypertrophy  03/22/2013   .  Contraceptive management  03/22/2013   Plan:  Patient will undergo surgical management with labioplasty. The risks of surgery were discussed in detail with the patient including but not limited to: bleeding which may require transfusion or reoperation; infection which may require antibiotics; injury to surrounding tissues, thromboembolic phenomenon, surgical site problems and other postoperative/anesthesia complications. Likelihood of success in alleviating the patient's condition was discussed. Routine postoperative instructions will be reviewed with the patient and her family in detail after surgery.  The patient concurred with the proposed plan, giving informed written consent for the surgery. Patient has been NPO since last night she will remain NPO for procedure. Anesthesia and OR aware. Preoperative prophylactic antibiotics and SCDs ordered on call to the OR. To OR when ready.  Tilda Burrow, MD  05/09/2013 2:13 PM

## 2013-05-12 NOTE — Op Note (Signed)
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 Details of procedure: this dictation will be for the labioplasty only. Dr. Malvin JohnsBradford will dictate hernia repairs as a separate procedure Patient was placed in low lithotomy like support, perineum prepped and draped with Foley catheter already in place. Timeout was confirmed. The redundant midportion of the labia minora extending from the level of the urethral orifice 2 just above the posterior fourchette identified a redundant flap of labial tissue 2 cm long on each side by 3 cm vertical length. This was marked with a marking pen, and then #15 blade used to score the outer skin then needle-tip Bovie cautery on 30 W used to to transect the redundant tissues. Skin edges were clean. Subcuticular 4-0 Vicryl closure of the excision site was then performed. The area beneath the labioplasty was then infiltrated with Marcaine with epinephrine x10 cc and patient went to recovery room in hemostasis satisfactory Foley catheter was removed patient will be observed overnight per Dr. Malvin JohnsBradford for the herniorrhaphy and discharge in a.m. per Dr. Malvin JohnsBradford. sponge and needle counts correct

## 2013-05-12 NOTE — Brief Op Note (Signed)
05/12/2013  9:00 AM  PATIENT:  Allison Miles  28 y.o. female  PRE-OPERATIVE DIAGNOSIS:  umbilical hernia, labial hypertrophy  POST-OPERATIVE DIAGNOSIS:  umbilical hernia, labial hypertrophy  PROCEDURE:  Procedure(s): UMBILICAL HERNIA REPAIR WITHOUT MESH (N/A) Labial Reduction/Labioplasty (Bilateral)  SURGEON:  Surgeon(s) and Role: Panel 1:    * Marlane HatcherWilliam S Shamikia Linskey, MD - Primary  Panel 2:    * Tilda BurrowJohn V Ferguson, MD - Primary  PHYSICIAN ASSISTANT:   ASSISTANTS: none   ANESTHESIA:   general  EBL:  Total I/O In: 1000 [I.V.:1000] Out: -   BLOOD ADMINISTERED:none  DRAINS: none   LOCAL MEDICATIONS USED:  MARCAINE  ~10cc  SPECIMEN:  Source of Specimen:  incarcerated omentum.  DISPOSITION OF SPECIMEN:  PATHOLOGY  COUNTS:  YES  TOURNIQUET:  * No tourniquets in log *  DICTATION: .Other Dictation: Dictation Number Or dict. # [id number].  PLAN OF CARE: Admit for overnight observation  PATIENT DISPOSITION:  PACU - hemodynamically stable.   Delay start of Pharmacological VTE agent (>24hrs) due to surgical blood loss or risk of bleeding: not applicable

## 2013-05-12 NOTE — Progress Notes (Signed)
Brief Nutrition Note   Screened for Malnutrition Screening Tool Score and screened for a positive score of 2. Pt was "unsure" if she had lost any weight. Per weight history, pt is within range of a normal weight for her and has not lost a significant amount of weight.   Height: Ht Readings from Last 1 Encounters:  05/10/13 5\' 10"  (1.778 m)    Weight: Wt Readings from Last 1 Encounters:  05/10/13 195 lb 9.6 oz (88.724 kg)    Wt Readings from Last 10 Encounters:  05/10/13 195 lb 9.6 oz (88.724 kg)  05/09/13 197 lb 9.6 oz (89.631 kg)  03/22/13 195 lb 12.8 oz (88.814 kg)  02/19/13 190 lb (86.183 kg)  10/29/12 192 lb (87.091 kg)  05/11/12 200 lb 9.6 oz (90.992 kg)  04/29/12 200 lb (90.719 kg)  03/17/12 216 lb (97.977 kg)  03/11/12 216 lb 9.6 oz (98.249 kg)  12/26/11 193 lb (87.544 kg)   Per RD discretion, pt does not need nutritional services at this time. Please consult if needed.   Carloyn Mannerebekah Lashana Spang, BS Nutrition Intern Pager: [phone]

## 2013-05-12 NOTE — Progress Notes (Signed)
Post OP Check  Filed Vitals:   05/12/13 1426  BP: 107/69  Pulse: 57  Temp: 97.8 F (36.6 C)  Resp: 18    Awake and alert.  Some expected incisional discomfort.  Wound clean and dry.  Has tolerated liquids and has begun to ambulate.  Has voided.  Adjusting pain meds.  Pt doing well; discharge planned in AM.

## 2013-05-12 NOTE — Progress Notes (Signed)
27 yr. Old W. Female for elective repair of umbilical hernia.  Labs reviewed and procedure and risks explained and informed consent obtained.  Dr. Emelda FearFerguson has planned a vaginal procedure as well. Filed Vitals:   05/12/13 0638  BP: 112/63  Pulse: 67  Temp: 97.7 F (36.5 C)  Resp: 18  No clinical change in H&P, dict. # [id number].

## 2013-05-12 NOTE — Transfer of Care (Signed)
Immediate Anesthesia Transfer of Care Note  Patient: Allison Miles  Procedure(s) Performed: Procedure(s) (LRB): UMBILICAL HERNIA REPAIR WITHOUT MESH (N/A) Labial Reduction/Labioplasty (Bilateral)  Patient Location: PACU  Anesthesia Type: General  Level of Consciousness: awake  Airway & Oxygen Therapy: Patient Spontanous Breathing and non-rebreather face mask  Post-op Assessment: Report given to PACU RN, Post -op Vital signs reviewed and stable and Patient moving all extremities  Post vital signs: Reviewed and stable  Complications: No apparent anesthesia complications

## 2013-05-12 NOTE — Op Note (Signed)
NAMCecilie Kicks:  Derhammer, Ahlani             ACCOUNT NO.:  [id number]  MEDICAL RECORD NO.:  [id number]  LOCATION:  [facility]                          FACILITY:  [facility]  PHYSICIAN:  Barbaraann BarthelWilliam Jearldine Cassady, M.D. DATE OF BIRTH:  [birth year]  DATE OF PROCEDURE:  05/12/2013 DATE OF DISCHARGE:                              OPERATIVE REPORT   SURGEON:  Barbaraann BarthelWilliam Zayan Delvecchio, M.D.  PREOPERATIVE DIAGNOSIS:  Umbilical hernia repair.  POSTOPERATIVE DIAGNOSIS:  Umbilical hernia repair.  PROCEDURE:  Repair of umbilical hernia (no mesh.)  WOUND CLASSIFICATION:  Clean.  SPECIMEN:  Incarcerated omentum.  NOTE:  This is a 28 year old white female who was referred for repair of umbilical hernia.  She had tenderness and a mass palpated in the slightly infraumbilical position.  We discussed the need for repair.  We also discussed the possible use of mesh.  We discussed complications not limited to including bleeding, infection, and recurrence, and informed consent was obtained.  The patient will also undergo a reduction of her labia minora for dyspareunia by Dr. Emelda FearFerguson at the same anesthesia.  For details of this, please consult his note.  TECHNIQUE:  The patient was placed in supine position and after the adequate administration of general anesthesia, her abdomen was prepped with Betadine solution and draped in usual manner.  Prior to this, a Foley catheter was aseptically inserted.  An infraumbilical incision was carried out in a curvilinear manner through skin and subcutaneous tissue down to the fascia.  The hernia defect was dissected free.  There was incarcerated omentum.  This was ligated and divided and sent as a specimen.  We then closed the umbilical hernia defect which was approximately the size of a 50 cent piece with interrupted figure of 8-0 Prolene sutures.  After checking for hemostasis, I then irrigated with normal saline solution.  I used approximately 10 mL of 0.5% Sensorcaine for postoperative  comfort and then closed the subcutaneous layer with 3- 0 Polysorb and the skin with a stapling device.  Neosporin and sterile dressing with Tegaderm was applied.  Prior to closure, all sponge, needle, and instrument counts were found to be correct.  Estimated blood loss was less than 20 mL.  The patient received a 900 mL of crystalloids intraoperatively.  There were no complications.  Dr. Emelda FearFerguson then went into do his portion of the procedure.  The patient will be brought to the recovery room and will be placed on my service postoperatively.  Dr. Emelda FearFerguson will follow up regarding his surgery, and I will follow up regarding mine.     Barbaraann BarthelWilliam Zayaan Kozak, M.D.     WB/MEDQ  D:  05/12/2013  T:  05/12/2013  Job:  [id number]  cc:   Tilda BurrowJohn V. Ferguson, M.D. Fax: [phone]

## 2013-05-13 MED ORDER — OXYCODONE-ACETAMINOPHEN 5-325 MG PO TABS: 1.0000 | ORAL_TABLET | ORAL | Status: DC | PRN

## 2013-05-13 NOTE — Progress Notes (Signed)
POD #1  Filed Vitals:   05/13/13 1049  BP: 103/44  Pulse: 62  Temp: 98 F (36.7 C)  Resp: 18   Wound clean and dry and dressings changed.  Abdomen soft with some incisional discomfort that is controlled with current pain regimen.  Pt voiding well apparently little discomfort from labial surgery. Discharge and follow up arranged.  Discharge note dictated. Dict. # [id number].

## 2013-05-13 NOTE — Progress Notes (Signed)
UR chart review completed.  

## 2013-05-14 NOTE — Discharge Summary (Signed)
NAMCecilie Kicks:  Allison Miles, [name]             ACCOUNT NO.:  [id number]  MEDICAL RECORD NO.:  [id number]  LOCATION:  [id number]                          FACILITY:  [facility]  PHYSICIAN:  Barbaraann BarthelWilliam Uchechukwu Dhawan, M.D. DATE OF BIRTH:  [birth year]  DATE OF ADMISSION:  05/12/2013 DATE OF DISCHARGE:  04/03/2015LH                              DISCHARGE SUMMARY   On May 12, 2013, the patient had an umbilical herniorrhaphy by Dr. Malvin JohnsBradford and gynecological surgery (labial reduction surgery by Dr. Emelda FearFerguson.)  NOTE:  This is a 28 year old white female who had an umbilical hernia present for approximately 1 year.  This had increased in discomfort and size and she was referred for repair.  She also had severe dyspareunia because of her labial minora and this was to be cared for by Dr. Emelda FearFerguson.  We decided to use the same anesthesia in order to take care of both of these problems.  She was admitted via the outpatient department and underwent uneventful surgery by me as well as by Dr. Emelda FearFerguson.  She was discharged on the following day after observation.  At which time, she was voiding well, without any dysuria.  Her wounds were clean without any sign of infection.  She had no shortness of breath, chest pain, or leg pain. She was tolerating p.o. well and her wound was very clean.  We have made followup arrangements for her and she is to return to my office for followup, and I will follow her perioperatively after which she is to continue under the care of Dr. Emelda FearFerguson for her gynecological problems.     Barbaraann BarthelWilliam [name], M.D.     WB/MEDQ  D:  05/13/2013  T:  05/13/2013  Job:  [id number]  cc:   Tilda BurrowJohn V. Ferguson, M.D. Fax: [phone]

## 2013-05-17 ENCOUNTER — Encounter (HOSPITAL_COMMUNITY): Payer: Self-pay | Admitting: General Surgery

## 2013-05-25 ENCOUNTER — Ambulatory Visit (INDEPENDENT_AMBULATORY_CARE_PROVIDER_SITE_OTHER): Payer: Self-pay | Admitting: Obstetrics and Gynecology

## 2013-05-25 ENCOUNTER — Encounter: Payer: Self-pay | Admitting: Obstetrics and Gynecology

## 2013-05-25 VITALS — BP 100/66 | Ht 70.0 in | Wt 194.0 lb

## 2013-05-25 DIAGNOSIS — Z9889 Other specified postprocedural states: Secondary | ICD-10-CM

## 2013-05-25 NOTE — Progress Notes (Signed)
This chart was scribed by Bennett Scrapehristina Taylor, Medical Scribe, for Dr. Christin BachJohn Tahiry Spicer on 05/25/13 at 2:07 PM. This chart was reviewed by Dr. Christin BachJohn Jameisha Stofko and is accurate.  Subjective:  Allison Miles who presents to the clinic 2 weeks status post labioplasty with umbilical hernia done by Dr. Malvin JohnsBradford as a separate procedure  Review of Systems Negative except right labial burning pain around the clitoris and incision drainage. Applying neosporin to the area.  She has been eating a regular diet without difficulty.   Bowel movements are normal on colace. Pain is controlled with OTC medications.  Objective:  BP 100/66  Ht 5\' 10"  (1.778 m)  Wt 194 lb (87.998 kg)  BMI 27.84 kg/m2  LMP 05/09/2013  Breastfeeding? No Chaperone present for exam which was performed with pt's permission General:Well developed, well nourished.  No acute distress. Abdomen: Bowel sounds normal, soft, non-tender. Pelvic Exam:    External Genitalia:  Right side has a slight eversion of skin edges, 75% healed. Left side has a stitch that has pulled out, 5mm skin gap which will have to heal by secondary tension.     Vagina: Normal    Internal exam not performed   Umbilical Incision(s):   Healing well, no drainage, no erythema, no hernia, no swelling, no dehiscence, incision well approximated.   Assessment:  Post-Op 2 weeks s/p  labioplasty with umbilical hernia done by Dr. Malvin JohnsBradford as a separate procedure Doing well postoperatively.   Plan:  1.Wound care discussed-Aloe and Vitamin E oil, neosporin PRN  2. .Continue any current medications. 3. Activity restrictions: none 4. return to work: not applicable. 5. Follow up in 2 weeks.

## 2013-06-08 ENCOUNTER — Encounter: Payer: Self-pay | Admitting: Obstetrics and Gynecology

## 2013-06-08 ENCOUNTER — Ambulatory Visit (INDEPENDENT_AMBULATORY_CARE_PROVIDER_SITE_OTHER): Payer: Self-pay | Admitting: Obstetrics and Gynecology

## 2013-06-08 VITALS — BP 112/70 | Ht 70.0 in | Wt 193.0 lb

## 2013-06-08 DIAGNOSIS — Z9889 Other specified postprocedural states: Secondary | ICD-10-CM

## 2013-06-08 NOTE — Progress Notes (Signed)
This chart was scribed by Bennett Scrapehristina Taylor, Medical Scribe, for Dr. Christin BachJohn Cheron Pasquarelli on 06/08/13 at 2:42 PM. This chart was reviewed by Dr. Christin BachJohn Beola Vasallo and is accurate.  Subjective:  Allison OverlyHeather L Miles is a 28 y.o. female who presents to the clinic 4 weeks status post labioplasty with umbilical hernia done by Dr. Malvin JohnsBradford as a separate procedure.  Review of Systems Negative except vaginal bleeding   She has been eating a regular diet without difficulty.   Bowel movements are normal on colace. The patient is not having any pain.  Objective:  BP 112/70  Ht 5\' 10"  (1.778 m)  Wt 193 lb (87.544 kg)  BMI 27.69 kg/m2  Breastfeeding? No Chaperone present for exam which was performed with pt's permission General:Well developed, well nourished.  No acute distress. Abdomen: Bowel sounds normal, soft, non-tender. Pelvic Exam:    External Genitalia: One stitch removed from right side. Stitch on left side is partial buried, too uncomfortable to remove. slight eversion of skin edge right side is fully healed. Left side is 90% healed   Vulvar irritation Incision(s):  Healing well, no drainage, no erythema, no hernia, no swelling, no dehiscence, incision well approximated.    Assessment:  Post-Op 4 weeks s/p labioplasty with umbilical hernia done by Dr. Malvin JohnsBradford as a separate procedure Doing well postoperatively. Vulvar irritation    Plan:  1.Wound care discussed-Aloe and Vitamin E oil, neosporin PRN 2. Use Vagisil for vulvar irritation. 2. .Continue any current medications. 3. Activity restrictions: none 4. return to work: not applicable. 5. Follow up PRN

## 2013-12-12 ENCOUNTER — Encounter: Payer: Self-pay | Admitting: Obstetrics and Gynecology

## 2014-04-16 ENCOUNTER — Encounter (HOSPITAL_COMMUNITY): Payer: Self-pay

## 2014-04-16 ENCOUNTER — Emergency Department (HOSPITAL_COMMUNITY)
Admission: EM | Admit: 2014-04-16 | Discharge: 2014-04-16 | Disposition: A | Discharge status: Home / Self Care | Payer: Medicaid Other | Attending: Emergency Medicine | Admitting: Emergency Medicine

## 2014-04-16 DIAGNOSIS — R6883 Chills (without fever): Secondary | ICD-10-CM | POA: Insufficient documentation

## 2014-04-16 DIAGNOSIS — K088 Other specified disorders of teeth and supporting structures: Secondary | ICD-10-CM | POA: Insufficient documentation

## 2014-04-16 DIAGNOSIS — K0889 Other specified disorders of teeth and supporting structures: Secondary | ICD-10-CM

## 2014-04-16 DIAGNOSIS — Z720 Tobacco use: Secondary | ICD-10-CM | POA: Insufficient documentation

## 2014-04-16 DIAGNOSIS — K029 Dental caries, unspecified: Secondary | ICD-10-CM | POA: Insufficient documentation

## 2014-04-16 DIAGNOSIS — Z862 Personal history of diseases of the blood and blood-forming organs and certain disorders involving the immune mechanism: Secondary | ICD-10-CM | POA: Insufficient documentation

## 2014-04-16 DIAGNOSIS — F900 Attention-deficit hyperactivity disorder, predominantly inattentive type: Secondary | ICD-10-CM | POA: Diagnosis not present

## 2014-04-16 DIAGNOSIS — R110 Nausea: Secondary | ICD-10-CM | POA: Insufficient documentation

## 2014-04-16 MED ORDER — PENICILLIN V POTASSIUM 500 MG PO TABS: 500.0000 mg | ORAL_TABLET | Freq: Four times a day (QID) | ORAL | Status: AC

## 2014-04-16 MED ORDER — TRAMADOL HCL 50 MG PO TABS
50.0000 mg | ORAL_TABLET | Freq: Four times a day (QID) | ORAL | Status: DC | PRN
Start: 2014-04-16 — End: 2015-07-31

## 2014-04-16 MED ORDER — AMOXICILLIN 250 MG PO CAPS
500.0000 mg | ORAL_CAPSULE | Freq: Once | ORAL | Status: AC
  Administered 2014-04-16: 500 mg via ORAL

## 2014-04-16 MED ORDER — AMOXICILLIN 250 MG PO CAPS
ORAL_CAPSULE | ORAL | Status: AC
  Filled 2014-04-16: qty 2

## 2014-04-16 MED ORDER — TRAMADOL HCL 50 MG PO TABS
ORAL_TABLET | ORAL | Status: AC
  Filled 2014-04-16: qty 1

## 2014-04-16 MED ORDER — TRAMADOL HCL 50 MG PO TABS
50.0000 mg | ORAL_TABLET | Freq: Once | ORAL | Status: AC
  Administered 2014-04-16: 50 mg via ORAL

## 2014-04-16 NOTE — Discharge Instructions (Signed)
Dental Pain  Toothache is pain in or around a tooth. It may get worse with chewing or with cold or heat.   HOME CARE  · Your dentist may use a numbing medicine during treatment. If so, you may need to avoid eating until the medicine wears off. Ask your dentist about this.  · Only take medicine as told by your dentist or doctor.  · Avoid chewing food near the painful tooth until after all treatment is done. Ask your dentist about this.  GET HELP RIGHT AWAY IF:   · The problem gets worse or new problems appear.  · You have a fever.  · There is redness and puffiness (swelling) of the face, jaw, or neck.  · You cannot open your mouth.  · There is pain in the jaw.  · There is very bad pain that is not helped by medicine.  MAKE SURE YOU:   · Understand these instructions.  · Will watch your condition.  · Will get help right away if you are not doing well or get worse.  Document Released: 07/16/2007 Document Revised: 04/21/2011 Document Reviewed: 07/16/2007  Exit[employer]® Patient Information ©2015 [employer]. This information is not intended to replace advice given to you by your health care provider. Make sure you discuss any questions you have with your health care provider.

## 2014-04-16 NOTE — ED Provider Notes (Signed)
CSN: [id number]     Arrival date & time 04/16/14  1859 History   First MD Initiated Contact with Patient 04/16/14 2015     Chief Complaint  Patient presents with  . Dental Pain     (Consider location/radiation/quality/duration/timing/severity/associated sxs/prior Treatment) HPI   Allison Miles is a 29 y.o. female who presents to the Emergency Department complaining of dental pain for 3 days.  Pain is worse today.  She reports pain with chewing and having a foul taste in her mouth with swelling of her left lower gums.  She also notes having nausea nad chills today and thinks it may be associated with a dental infection.  She denies fever, vomiting, neck pain or facial swelling.  She has been taking ibuprofen and tylenol with slight relief.    Past Medical History  Diagnosis Date  . Anemia   . ADD (attention deficit disorder) without hyperactivity    Past Surgical History  Procedure Laterality Date  . Diagnostic laparoscopy      tacked pelvic ligaments due to prolapse and removed iud  . Cesarean section    . Uterine suspension    . Iud removal    . Umbilical hernia repair N/A 05/12/2013    Procedure: UMBILICAL HERNIA REPAIR WITHOUT MESH;  Surgeon: Allison HatcherWilliam S Bradford, MD;  Location: [facility];  Service: General;  Laterality: N/A;  . Vulvectomy Bilateral 05/12/2013    Procedure: Labial Reduction/Labioplasty;  Surgeon: Allison BurrowJohn V Ferguson, MD;  Location: [facility];  Service: Gynecology;  Laterality: Bilateral;   Family History  Problem Relation Age of Onset  . Anesthesia problems Neg Hx   . Hypotension Neg Hx   . Malignant hyperthermia Neg Hx   . Pseudochol deficiency Neg Hx   . Stroke Paternal Grandfather     also had ms  . Cancer Maternal Grandmother     brain  . Diabetes Maternal Uncle   . Cancer Mother     oral and cervical   History  Substance Use Topics  . Smoking status: Current Every Day Smoker -- 1.00 packs/day for 2 years    Types: Cigarettes  . Smokeless tobacco: Never  Used  . Alcohol Use: 0.0 oz/week     Comment: occasional drinks alcohol   OB History    Gravida Para Term Preterm AB TAB SAB Ectopic Multiple Living   2 2 2  0 0 0 0 0 0 2     Review of Systems  Constitutional: Negative for fever and appetite change.  HENT: Positive for dental problem. Negative for congestion, facial swelling, sore throat and trouble swallowing.   Eyes: Negative for pain and visual disturbance.  Musculoskeletal: Negative for neck pain and neck stiffness.  Neurological: Negative for dizziness, facial asymmetry and headaches.  Hematological: Negative for adenopathy.  All other systems reviewed and are negative.     Allergies  Review of patient's allergies indicates no known allergies.  Home Medications   Prior to Admission medications   Medication Sig Start Date End Date Taking? Authorizing Provider  amphetamine-dextroamphetamine (ADDERALL) 10 MG tablet Take 10 mg by mouth daily with breakfast.    Historical Provider, MD   BP 124/82 mmHg  Pulse 93  Temp(Src) 99.1 F (37.3 C) (Oral)  Resp 24  Ht 5\' 10"  (1.778 m)  Wt 182 lb (82.555 kg)  BMI 26.11 kg/m2  SpO2 100% Physical Exam  Constitutional: She is oriented to person, place, and time. She appears well-developed and well-nourished. No distress.  HENT:  Head: Normocephalic  and atraumatic.  Right Ear: Tympanic membrane and ear canal normal.  Left Ear: Tympanic membrane and ear canal normal.  Mouth/Throat: Uvula is midline, oropharynx is clear and moist and mucous membranes are normal. No trismus in the jaw. Dental caries present. No dental abscesses or uvula swelling.  Tenderness and dental caries of the left lower first molar. Erythema of the surrounding gums.  No facial swelling, obvious dental abscess, trismus, or sublingual abnml.    Neck: Normal range of motion. Neck supple.  Cardiovascular: Normal rate, regular rhythm and normal heart sounds.   No murmur heard. Pulmonary/Chest: Effort normal and  breath sounds normal. No respiratory distress.  Musculoskeletal: Normal range of motion.  Lymphadenopathy:    She has no cervical adenopathy.  Neurological: She is alert and oriented to person, place, and time. She exhibits normal muscle tone. Coordination normal.  Skin: Skin is warm and dry.  Nursing note and vitals reviewed.   ED Course  Procedures (including critical care time) Labs Review Labs Reviewed - No data to display  Imaging Review No results found.   EKG Interpretation None      MDM   Final diagnoses:  Pain, dental    Patient is well appearing.  Airway patent. No concerning sx's for infection to the deep structures of the neck or floor of the mouth.  Pt agrees to f/u with her dentist this week.    Allison Miles 04/16/14 2039  Allison Lennert, MD 04/16/14 2223

## 2014-04-16 NOTE — ED Notes (Addendum)
Pain lower left molar, thinks she has an abcess

## 2014-06-22 ENCOUNTER — Other Ambulatory Visit (HOSPITAL_COMMUNITY)
Admission: RE | Admit: 2014-06-22 | Discharge: 2014-06-22 | Disposition: A | Discharge status: Home / Self Care | Payer: Medicaid Other | Source: Ambulatory Visit | Attending: Obstetrics & Gynecology | Admitting: Obstetrics & Gynecology

## 2014-06-22 ENCOUNTER — Ambulatory Visit (INDEPENDENT_AMBULATORY_CARE_PROVIDER_SITE_OTHER): Payer: Medicaid Other | Admitting: Obstetrics & Gynecology

## 2014-06-22 ENCOUNTER — Encounter: Payer: Self-pay | Admitting: Obstetrics & Gynecology

## 2014-06-22 VITALS — BP 132/82 | HR 80 | Ht 68.25 in | Wt 185.5 lb

## 2014-06-22 DIAGNOSIS — Z01419 Encounter for gynecological examination (general) (routine) without abnormal findings: Secondary | ICD-10-CM | POA: Insufficient documentation

## 2014-06-22 DIAGNOSIS — Z113 Encounter for screening for infections with a predominantly sexual mode of transmission: Secondary | ICD-10-CM | POA: Diagnosis present

## 2014-06-22 DIAGNOSIS — Z7251 High risk heterosexual behavior: Secondary | ICD-10-CM

## 2014-06-22 DIAGNOSIS — Z0000 Encounter for general adult medical examination without abnormal findings: Secondary | ICD-10-CM

## 2014-06-22 NOTE — Progress Notes (Signed)
Patient ID: Allison Miles, female   DOB: August 25, 1985, 29 y.o.   MRN: [id number] Subjective:     Allison OverlyHeather L Chouinard is a 29 y.o. female here for a routine exam.  No LMP recorded. Patient has had an implant. [id number] Birth Control Method:  Nexplanon Menstrual Calendar(currently): irregular on Nexplanon  Current complaints: wants STD panel.   Current acute medical issues:  none   Recent Gynecologic History No LMP recorded. Patient has had an implant. Last Pap: 2014,  normal Last mammogram: ,    Past Medical History  Diagnosis Date  . Anemia   . ADD (attention deficit disorder) without hyperactivity     Past Surgical History  Procedure Laterality Date  . Diagnostic laparoscopy      tacked pelvic ligaments due to prolapse and removed iud  . Cesarean section    . Uterine suspension    . Iud removal    . Umbilical hernia repair N/A 05/12/2013    Procedure: UMBILICAL HERNIA REPAIR WITHOUT MESH;  Surgeon: Marlane HatcherWilliam S Bradford, MD;  Location: [facility];  Service: General;  Laterality: N/A;  . Vulvectomy Bilateral 05/12/2013    Procedure: Labial Reduction/Labioplasty;  Surgeon: Tilda BurrowJohn V Ferguson, MD;  Location: [facility];  Service: Gynecology;  Laterality: Bilateral;    OB History    Gravida Para Term Preterm AB TAB SAB Ectopic Multiple Living   2 2 2  0 0 0 0 0 0 2      History   Social History  . Marital Status: Single    Spouse Name: N/A  . Number of Children: N/A  . Years of Education: N/A   Social History Main Topics  . Smoking status: Current Every Day Smoker -- 1.00 packs/day for 2 years    Types: Cigarettes  . Smokeless tobacco: Never Used  . Alcohol Use: 0.0 oz/week     Comment: occasional drinks alcohol  . Drug Use: No     Comment: occasional smoke marijuana- last joint few weeks.  Marland Kitchen. Sexual Activity: Not Currently    Birth Control/ Protection: Implant   Other Topics Concern  . None   Social History Narrative    Family History  Problem Relation Age of Onset  .  Anesthesia problems Neg Hx   . Hypotension Neg Hx   . Malignant hyperthermia Neg Hx   . Pseudochol deficiency Neg Hx   . Stroke Paternal Grandfather     also had ms  . Cancer Maternal Grandmother     brain  . Diabetes Maternal Uncle   . Cancer Mother     oral and cervical     Current outpatient prescriptions:  .  amphetamine-dextroamphetamine (ADDERALL) 10 MG tablet, Take 10 mg by mouth daily with breakfast., Disp: , Rfl:  .  traMADol (ULTRAM) 50 MG tablet, Take 1 tablet (50 mg total) by mouth every 6 (six) hours as needed. (Patient not taking: Reported on 06/22/2014), Disp: 15 tablet, Rfl: 0  Current facility-administered medications:  .  etonogestrel (IMPLANON) implant 68 mg, 68 mg, Subcutaneous, Once, Jacklyn ShellFrances Cresenzo-Dishmon, CNM  Review of Systems  Review of Systems  Constitutional: Negative for fever, chills, weight loss, malaise/fatigue and diaphoresis.  HENT: Negative for hearing loss, ear pain, nosebleeds, congestion, sore throat, neck pain, tinnitus and ear discharge.   Eyes: Negative for blurred vision, double vision, photophobia, pain, discharge and redness.  Respiratory: Negative for cough, hemoptysis, sputum production, shortness of breath, wheezing and stridor.   Cardiovascular: Negative for chest pain, palpitations, orthopnea, claudication, leg swelling  and PND.  Gastrointestinal: negative for abdominal pain. Negative for heartburn, nausea, vomiting, diarrhea, constipation, blood in stool and melena.  Genitourinary: Negative for dysuria, urgency, frequency, hematuria and flank pain.  Musculoskeletal: Negative for myalgias, back pain, joint pain and falls.  Skin: Negative for itching and rash.  Neurological: Negative for dizziness, tingling, tremors, sensory change, speech change, focal weakness, seizures, loss of consciousness, weakness and headaches.  Endo/Heme/Allergies: Negative for environmental allergies and polydipsia. Does not bruise/bleed easily.   Psychiatric/Behavioral: Negative for depression, suicidal ideas, hallucinations, memory loss and substance abuse. The patient is not nervous/anxious and does not have insomnia.        Objective:  Blood pressure 132/82, pulse 80, height 5' 8.25" (1.734 m), weight 185 lb 8 oz (84.142 kg).   Physical Exam  Vitals reviewed. Constitutional: She is oriented to person, place, and time. She appears well-developed and well-nourished.  HENT:  Head: Normocephalic and atraumatic.        Right Ear: External ear normal.  Left Ear: External ear normal.  Nose: Nose normal.  Mouth/Throat: Oropharynx is clear and moist.  Eyes: Conjunctivae and EOM are normal. Pupils are equal, round, and reactive to light. Right eye exhibits no discharge. Left eye exhibits no discharge. No scleral icterus.  Neck: Normal range of motion. Neck supple. No tracheal deviation present. No thyromegaly present.  Cardiovascular: Normal rate, regular rhythm, normal heart sounds and intact distal pulses.  Exam reveals no gallop and no friction rub.   No murmur heard. Respiratory: Effort normal and breath sounds normal. No respiratory distress. She has no wheezes. She has no rales. She exhibits no tenderness.  GI: Soft. Bowel sounds are normal. She exhibits no distension and no mass. There is no tenderness. There is no rebound and no guarding.  Genitourinary:  Breasts no masses skin changes or nipple changes bilaterally      Vulva is normal without lesions Vagina is pink moist without discharge Cervix normal in appearance and pap is done Uterus is normal size shape and contour Adnexa is negative with normal sized ovaries   Musculoskeletal: Normal range of motion. She exhibits no edema and no tenderness.  Neurological: She is alert and oriented to person, place, and time. She has normal reflexes. She displays normal reflexes. No cranial nerve deficit. She exhibits normal muscle tone. Coordination normal.  Skin: Skin is warm and  dry. No rash noted. No erythema. No pallor.  Psychiatric: She has a normal mood and affect. Her behavior is normal. Judgment and thought content normal.       Assessment:    Healthy female exam.    Plan:    Contraception: Nexplanon.   Follow up 1 year \\wants  STD check

## 2014-06-23 LAB — RPR: RPR Ser Ql: NONREACTIVE

## 2014-06-23 LAB — HIV ANTIBODY (ROUTINE TESTING W REFLEX): HIV Screen 4th Generation wRfx: NONREACTIVE

## 2014-06-23 LAB — HSV 2 ANTIBODY, IGG: HSV 2 Glycoprotein G Ab, IgG: <0.91 index (ref 0.00–0.90)

## 2014-06-23 LAB — HEPATITIS B SURFACE ANTIGEN: Hepatitis B Surface Ag: NEGATIVE

## 2014-06-23 LAB — HEPATITIS C ANTIBODY: Hep C Virus Ab: <0.1 s/co ratio (ref 0.0–0.9)

## 2014-06-26 LAB — CYTOLOGY - PAP

## 2015-06-18 ENCOUNTER — Ambulatory Visit (INDEPENDENT_AMBULATORY_CARE_PROVIDER_SITE_OTHER): Payer: Medicaid Other | Admitting: Obstetrics & Gynecology

## 2015-06-18 ENCOUNTER — Encounter: Payer: Self-pay | Admitting: Obstetrics & Gynecology

## 2015-06-18 VITALS — BP 120/70 | HR 74 | Ht 69.0 in | Wt 180.0 lb

## 2015-06-18 DIAGNOSIS — Z3046 Encounter for surveillance of implantable subdermal contraceptive: Secondary | ICD-10-CM | POA: Diagnosis not present

## 2015-06-18 MED ORDER — ETONOGESTREL-ETHINYL ESTRADIOL 0.12-0.015 MG/24HR VA RING: VAGINAL_RING | VAGINAL | Status: DC

## 2015-06-18 NOTE — Progress Notes (Signed)
Patient ID: Allison Miles, female   DOB: 1985-10-24, 30 y.o.   MRN: [id number] Chief Complaint  Patient presents with  . Nexplanon Removal    discuss bc options    Blood pressure 120/70, pulse 74, height 5\' 9"  (1.753 m), weight 180 lb (81.647 kg).  Pt here for nexplanon removal, it has been in for 3 years  Wants nuva ring  Meds ordered this encounter  Medications  . etonogestrel-ethinyl estradiol (NUVARING) 0.12-0.015 MG/24HR vaginal ring    Sig: Insert vaginally and leave in place for 3 consecutive weeks, then remove for 1 week.    Dispense:  1 each    Refill:  12    Left arm prepped, 2% lidocaine injected  11 blade used to make a stab incision  Curved hemostat used and the nexplanon removed without difficulty  Steri strips placed and dressed  Follow up for yearly, will make appt

## 2015-07-31 ENCOUNTER — Encounter: Payer: Self-pay | Admitting: Obstetrics & Gynecology

## 2015-07-31 ENCOUNTER — Other Ambulatory Visit (HOSPITAL_COMMUNITY)
Admission: RE | Admit: 2015-07-31 | Discharge: 2015-07-31 | Disposition: A | Discharge status: Home / Self Care | Payer: Medicaid Other | Source: Ambulatory Visit | Attending: Obstetrics & Gynecology | Admitting: Obstetrics & Gynecology

## 2015-07-31 ENCOUNTER — Ambulatory Visit (INDEPENDENT_AMBULATORY_CARE_PROVIDER_SITE_OTHER): Payer: Medicaid Other | Admitting: Obstetrics & Gynecology

## 2015-07-31 VITALS — BP 100/60 | HR 60 | Ht 70.0 in | Wt 182.0 lb

## 2015-07-31 DIAGNOSIS — Z01419 Encounter for gynecological examination (general) (routine) without abnormal findings: Secondary | ICD-10-CM | POA: Insufficient documentation

## 2015-07-31 DIAGNOSIS — Z0000 Encounter for general adult medical examination without abnormal findings: Secondary | ICD-10-CM

## 2015-07-31 MED ORDER — ETONOGESTREL-ETHINYL ESTRADIOL 0.12-0.015 MG/24HR VA RING: VAGINAL_RING | VAGINAL | Status: DC

## 2015-07-31 NOTE — Progress Notes (Signed)
Patient ID: Allison Miles, female   DOB: 03-11-1985, 30 y.o.   MRN: [id number] Subjective:     Allison Miles is a 30 y.o. female here for a routine exam.  No LMP recorded. [id number] Birth Control Method:  Nuva Ring Menstrual Calendar(currently): regular  Current complaints: none.   Current acute medical issues:  none   Recent Gynecologic History No LMP recorded. Last Pap: 2016,  normal Last mammogram: ,    Past Medical History  Diagnosis Date  . Anemia   . ADD (attention deficit disorder) without hyperactivity     Past Surgical History  Procedure Laterality Date  . Diagnostic laparoscopy      tacked pelvic ligaments due to prolapse and removed iud  . Cesarean section    . Uterine suspension    . Iud removal    . Umbilical hernia repair N/A 05/12/2013    Procedure: UMBILICAL HERNIA REPAIR WITHOUT MESH;  Surgeon: Marlane Hatcher, MD;  Location: [facility];  Service: General;  Laterality: N/A;  . Vulvectomy Bilateral 05/12/2013    Procedure: Labial Reduction/Labioplasty;  Surgeon: Tilda Burrow, MD;  Location: [facility];  Service: Gynecology;  Laterality: Bilateral;    OB History    Gravida Para Term Preterm AB TAB SAB Ectopic Multiple Living   0 0 0 0 0 0 2      Social History   Social History  . Marital Status: Single    Spouse Name: N/A  . Number of Children: N/A  . Years of Education: N/A   Social History Main Topics  . Smoking status: Current Every Day Smoker -- 1.00 packs/day for 2 years    Types: Cigarettes  . Smokeless tobacco: Never Used  . Alcohol Use: 0.0 oz/week     Comment: occasional drinks alcohol  . Drug Use: No     Comment: occasional smoke marijuana- last joint few weeks.  Marland Kitchen Sexual Activity: Not Currently    Birth Control/ Protection: Implant   Other Topics Concern  . None   Social History Narrative    Family History  Problem Relation Age of Onset  . Anesthesia problems Neg Hx   . Hypotension Neg Hx   . Malignant hyperthermia  Neg Hx   . Pseudochol deficiency Neg Hx   . Stroke Paternal Grandfather     also had ms  . Cancer Maternal Grandmother     brain  . Diabetes Maternal Uncle   . Cancer Mother     oral and cervical     Current outpatient prescriptions:  .  amphetamine-dextroamphetamine (ADDERALL) 10 MG tablet, Take 10 mg by mouth daily with breakfast., Disp: , Rfl:  .  etonogestrel-ethinyl estradiol (NUVARING) 0.12-0.015 MG/24HR vaginal ring, Insert vaginally and leave in place for 3 consecutive weeks, then remove for 1 week., Disp: 1 each, Rfl: 12 .  meloxicam (MOBIC) 15 MG tablet, Take 15 mg by mouth daily., Disp: , Rfl:   Review of Systems  Review of Systems  Constitutional: Negative for fever, chills, weight loss, malaise/fatigue and diaphoresis.  HENT: Negative for hearing loss, ear pain, nosebleeds, congestion, sore throat, neck pain, tinnitus and ear discharge.   Eyes: Negative for blurred vision, double vision, photophobia, pain, discharge and redness.  Respiratory: Negative for cough, hemoptysis, sputum production, shortness of breath, wheezing and stridor.   Cardiovascular: Negative for chest pain, palpitations, orthopnea, claudication, leg swelling and PND.  Gastrointestinal: negative for abdominal pain. Negative for heartburn, nausea, vomiting, diarrhea, constipation, blood in  stool and melena.  Genitourinary: Negative for dysuria, urgency, frequency, hematuria and flank pain.  Musculoskeletal: Negative for myalgias, back pain, joint pain and falls.  Skin: Negative for itching and rash.  Neurological: Negative for dizziness, tingling, tremors, sensory change, speech change, focal weakness, seizures, loss of consciousness, weakness and headaches.  Endo/Heme/Allergies: Negative for environmental allergies and polydipsia. Does not bruise/bleed easily.  Psychiatric/Behavioral: Negative for depression, suicidal ideas, hallucinations, memory loss and substance abuse. The patient is not  nervous/anxious and does not have insomnia.        Objective:  Blood pressure 100/60, pulse 60, height 5\' 10"  (1.778 m), weight 182 lb (82.555 kg).   Physical Exam  Vitals reviewed. Constitutional: She is oriented to person, place, and time. She appears well-developed and well-nourished.  HENT:  Head: Normocephalic and atraumatic.        Right Ear: External ear normal.  Left Ear: External ear normal.  Nose: Nose normal.  Mouth/Throat: Oropharynx is clear and moist.  Eyes: Conjunctivae and EOM are normal. Pupils are equal, round, and reactive to light. Right eye exhibits no discharge. Left eye exhibits no discharge. No scleral icterus.  Neck: Normal range of motion. Neck supple. No tracheal deviation present. No thyromegaly present.  Cardiovascular: Normal rate, regular rhythm, normal heart sounds and intact distal pulses.  Exam reveals no gallop and no friction rub.   No murmur heard. Respiratory: Effort normal and breath sounds normal. No respiratory distress. She has no wheezes. She has no rales. She exhibits no tenderness.  GI: Soft. Bowel sounds are normal. She exhibits no distension and no mass. There is no tenderness. There is no rebound and no guarding.  Genitourinary:  Breasts no masses skin changes or nipple changes bilaterally      Vulva is normal without lesions Vagina is pink moist without discharge Cervix normal in appearance and pap is done Uterus is normal size shape and contour Adnexa is negative with normal sized ovaries   Musculoskeletal: Normal range of motion. She exhibits no edema and no tenderness.  Neurological: She is alert and oriented to person, place, and time. She has normal reflexes. She displays normal reflexes. No cranial nerve deficit. She exhibits normal muscle tone. Coordination normal.  Skin: Skin is warm and dry. No rash noted. No erythema. No pallor.  Psychiatric: She has a normal mood and affect. Her behavior is normal. Judgment and thought  content normal.       Medications Ordered at today's visit: Meds ordered this encounter  Medications  . meloxicam (MOBIC) 15 MG tablet    Sig: Take 15 mg by mouth daily.  Marland Kitchen. etonogestrel-ethinyl estradiol (NUVARING) 0.12-0.015 MG/24HR vaginal ring    Sig: Insert vaginally and leave in place for 3 consecutive weeks, then remove for 1 week.    Dispense:  1 each    Refill:  12    Other orders placed at today's visit: No orders of the defined types were placed in this encounter.      Assessment:    Healthy female exam.    Plan:    Contraception: NuvaRing vaginal inserts. Follow up in: 1 year.     No Follow-up on file.

## 2015-08-02 LAB — CYTOLOGY - PAP

## 2016-01-28 ENCOUNTER — Ambulatory Visit: Payer: Medicaid Other | Admitting: Physical Therapy

## 2016-02-12 ENCOUNTER — Ambulatory Visit: Payer: Medicaid Other | Attending: Family Medicine | Admitting: Physical Therapy

## 2016-08-14 ENCOUNTER — Other Ambulatory Visit: Payer: Self-pay | Admitting: Obstetrics & Gynecology

## 2016-08-27 ENCOUNTER — Other Ambulatory Visit (HOSPITAL_COMMUNITY)
Admission: RE | Admit: 2016-08-27 | Discharge: 2016-08-27 | Disposition: A | Discharge status: Home / Self Care | Payer: Medicaid Other | Source: Ambulatory Visit | Attending: Obstetrics and Gynecology | Admitting: Obstetrics and Gynecology

## 2016-08-27 ENCOUNTER — Ambulatory Visit (INDEPENDENT_AMBULATORY_CARE_PROVIDER_SITE_OTHER): Payer: Medicaid Other | Admitting: Obstetrics and Gynecology

## 2016-08-27 ENCOUNTER — Encounter: Payer: Self-pay | Admitting: Obstetrics and Gynecology

## 2016-08-27 VITALS — BP 130/80 | HR 78 | Ht 69.75 in | Wt 188.4 lb

## 2016-08-27 DIAGNOSIS — Z124 Encounter for screening for malignant neoplasm of cervix: Secondary | ICD-10-CM | POA: Insufficient documentation

## 2016-08-27 DIAGNOSIS — Z113 Encounter for screening for infections with a predominantly sexual mode of transmission: Secondary | ICD-10-CM | POA: Diagnosis not present

## 2016-08-27 DIAGNOSIS — R87612 Low grade squamous intraepithelial lesion on cytologic smear of cervix (LGSIL): Secondary | ICD-10-CM | POA: Diagnosis not present

## 2016-08-27 DIAGNOSIS — Z01411 Encounter for gynecological examination (general) (routine) with abnormal findings: Secondary | ICD-10-CM | POA: Diagnosis not present

## 2016-08-27 DIAGNOSIS — R87810 Cervical high risk human papillomavirus (HPV) DNA test positive: Secondary | ICD-10-CM | POA: Insufficient documentation

## 2016-08-27 DIAGNOSIS — N898 Other specified noninflammatory disorders of vagina: Secondary | ICD-10-CM

## 2016-08-27 DIAGNOSIS — Z0000 Encounter for general adult medical examination without abnormal findings: Secondary | ICD-10-CM

## 2016-08-27 LAB — POCT WET PREP WITH KOH: WBC WET PREP PER HPF POC: POSITIVE

## 2016-08-27 MED ORDER — METRONIDAZOLE 500 MG PO TABS: 500.0000 mg | ORAL_TABLET | Freq: Two times a day (BID) | ORAL | 0 refills | Status: DC

## 2016-08-27 NOTE — Progress Notes (Signed)
Assessment:  Annual Gyn Exam  BV Plan:  1. pap smear done, next pap due 3 years 2. return annually or prn 3    Annual mammogram advised 4., STI screen with HIV RPR HEP C  HEP B and PAP Subjective:  Allison Miles is a 31 y.o. female [id number] who presents for annual exam. No LMP recorded. Patient is not currently having periods (Reason: Other). The patient has no complaints today. Pt [facility], and states she has been having increased hyperpigmentation around the face. She wants to discuss other [facility].  The following portions of the patient's history were reviewed and updated as appropriate: allergies, current medications, past family history, past medical history, past social history, past surgical history and problem list. Past Medical History:  Diagnosis Date  . ADD (attention deficit disorder) without hyperactivity   . Anemia     Past Surgical History:  Procedure Laterality Date  . CESAREAN SECTION    . DIAGNOSTIC LAPAROSCOPY     tacked pelvic ligaments due to prolapse and removed iud  . IUD REMOVAL    . UMBILICAL HERNIA REPAIR N/A 05/12/2013   Procedure: UMBILICAL HERNIA REPAIR WITHOUT MESH;  Surgeon: Marlane HatcherWilliam S Bradford, MD;  Location: [facility];  Service: General;  Laterality: N/A;  . UTERINE SUSPENSION    . VULVECTOMY Bilateral 05/12/2013   Procedure: Labial Reduction/Labioplasty;  Surgeon: Tilda BurrowJohn V Gohan Collister, MD;  Location: [facility];  Service: Gynecology;  Laterality: Bilateral;     Current Outpatient Prescriptions:  .  amphetamine-dextroamphetamine (ADDERALL) 10 MG tablet, Take 10 mg by mouth daily with breakfast., Disp: , Rfl:  .  meloxicam (MOBIC) 15 MG tablet, Take 15 mg by mouth as needed. , Disp: , Rfl:  .  NUVARING 0.12-0.015 MG/24HR vaginal ring, INSERT VAGINALLY AND LEAVE IN PLACE FOR 3 CONSECUTIVE WEEKS, THEN REMOVE FOR 1 WEEK., Disp: 1 each, Rfl: 11 .  tiZANidine (ZANAFLEX) 4 MG tablet, Take 4 mg by mouth as needed. , Disp: , Rfl:   Review  of Systems Constitutional: negative Gastrointestinal: negative Genitourinary: negative  Objective:  BP 130/80 (BP Location: Right Arm, Patient Position: Sitting, Cuff Size: Normal)   Pulse 78   Ht 5' 9.75" (1.772 m)   Wt 188 lb 6.4 oz (85.5 kg)   BMI 27.23 kg/m    BMI: Body mass index is 27.23 kg/m.  General Appearance: Alert, appropriate appearance for age. No acute distress HEENT: Grossly normal Neck / Thyroid:  Cardiovascular: RRR; normal S1, S2, no murmur Lungs: CTA bilaterally Back: No CVAT Breast Exam: No masses or nodes.No dimpling, nipple retraction or discharge. Gastrointestinal: Soft, non-tender, no masses or organomegaly Pelvic Exam:  External genitalia: normal general appearance Vaginal: normal mucosa without prolapse or lesions, moderate amount of vaginal discharge Cervix: normal appearance, endocervical eversion status/post LEEP, well healed, moderate lugareaEverted cervix Adnexa: normal bimanual exam Uterus: normal single, nontender Rectovaginal: not indicated Lymphatic Exam: Non-palpable nodes in neck, clavicular, axillary, or inguinal regions  Skin: no rash or abnormalities Neurologic: Normal gait and speech, no tremor  Psychiatric: Alert and oriented, appropriate affect.  Urinalysis:Not done   Wet prep done, moderate clue, moderate epithelial  Christin BachJohn Munachimso Palin. MD Pgr [phone] 2:19 PM    By signing my name below, I, Izna Ahmed, attest that this documentation has been prepared under the direction and in the presence of Tilda BurrowFerguson, Keleigh Kazee V, MD. Electronically Signed: Redge GainerIzna Ahmed, ED Scribe. 08/27/16. 2:19 PM.  .I personally performed the services described in this documentation,  which was SCRIBED in my presence. The recorded information has been reviewed and considered accurate. It has been edited as necessary during review. Jonnie Kind, MD

## 2016-08-28 LAB — HEPATITIS B SURFACE ANTIGEN: HEP B S AG: NEGATIVE

## 2016-08-28 LAB — HEPATITIS C ANTIBODY (REFLEX): HCV Ab: <0.1 s/co ratio (ref 0.0–0.9)

## 2016-08-28 LAB — HIV ANTIBODY (ROUTINE TESTING W REFLEX): HIV SCREEN 4TH GENERATION: NONREACTIVE

## 2016-08-28 LAB — HCV COMMENT:

## 2016-08-28 LAB — RPR: RPR Ser Ql: NONREACTIVE

## 2016-08-29 ENCOUNTER — Encounter: Payer: Self-pay | Admitting: Obstetrics and Gynecology

## 2016-08-31 LAB — CYTOLOGY - PAP
Chlamydia: NEGATIVE
HPV: DETECTED — AB
Neisseria Gonorrhea: NEGATIVE

## 2016-10-03 ENCOUNTER — Encounter: Payer: Self-pay | Admitting: *Deleted

## 2016-11-19 ENCOUNTER — Encounter: Payer: Self-pay | Admitting: Obstetrics and Gynecology

## 2016-12-01 ENCOUNTER — Encounter: Payer: Self-pay | Admitting: Obstetrics and Gynecology

## 2016-12-05 ENCOUNTER — Emergency Department (HOSPITAL_COMMUNITY): Payer: Medicaid Other

## 2016-12-05 ENCOUNTER — Encounter (HOSPITAL_COMMUNITY): Payer: Self-pay | Admitting: *Deleted

## 2016-12-05 ENCOUNTER — Emergency Department (HOSPITAL_COMMUNITY)
Admission: EM | Admit: 2016-12-05 | Discharge: 2016-12-05 | Disposition: A | Discharge status: Home / Self Care | Payer: Medicaid Other | Attending: Emergency Medicine | Admitting: Emergency Medicine

## 2016-12-05 DIAGNOSIS — Z79899 Other long term (current) drug therapy: Secondary | ICD-10-CM | POA: Diagnosis not present

## 2016-12-05 DIAGNOSIS — Z885 Allergy status to narcotic agent status: Secondary | ICD-10-CM | POA: Diagnosis not present

## 2016-12-05 DIAGNOSIS — M5126 Other intervertebral disc displacement, lumbar region: Secondary | ICD-10-CM | POA: Diagnosis not present

## 2016-12-05 DIAGNOSIS — F17210 Nicotine dependence, cigarettes, uncomplicated: Secondary | ICD-10-CM | POA: Insufficient documentation

## 2016-12-05 DIAGNOSIS — M545 Low back pain: Secondary | ICD-10-CM | POA: Diagnosis present

## 2016-12-05 LAB — CBC
HEMATOCRIT: 40.1 % (ref 36.0–46.0)
Hemoglobin: 13.2 g/dL (ref 12.0–15.0)
MCH: 30.9 pg (ref 26.0–34.0)
MCHC: 32.9 g/dL (ref 30.0–36.0)
MCV: 93.9 fL (ref 78.0–100.0)
PLATELETS: 239 10*3/uL (ref 150–400)
RBC: 4.27 MIL/uL (ref 3.87–5.11)
RDW: 12.9 % (ref 11.5–15.5)
WBC: 9.7 10*3/uL (ref 4.0–10.5)

## 2016-12-05 LAB — URINALYSIS, COMPLETE (UACMP) WITH MICROSCOPIC
Bilirubin Urine: NEGATIVE
GLUCOSE, UA: NEGATIVE mg/dL
HGB URINE DIPSTICK: NEGATIVE
KETONES UR: NEGATIVE mg/dL
LEUKOCYTES UA: NEGATIVE
NITRITE: POSITIVE — AB
PROTEIN: 30 mg/dL — AB
Specific Gravity, Urine: 1.025 (ref 1.005–1.030)
pH: 6 (ref 5.0–8.0)

## 2016-12-05 LAB — BASIC METABOLIC PANEL
ANION GAP: 5 (ref 5–15)
BUN: 16 mg/dL (ref 6–20)
CHLORIDE: 108 mmol/L (ref 101–111)
CO2: 25 mmol/L (ref 22–32)
Calcium: 8.8 mg/dL — ABNORMAL LOW (ref 8.9–10.3)
Creatinine, Ser: 0.89 mg/dL (ref 0.44–1.00)
GFR calc Af Amer: >60 mL/min (ref >60)
GFR calc non Af Amer: >60 mL/min (ref >60)
GLUCOSE: 97 mg/dL (ref 65–99)
POTASSIUM: 3.9 mmol/L (ref 3.5–5.1)
Sodium: 138 mmol/L (ref 135–145)

## 2016-12-05 MED ORDER — SODIUM CHLORIDE 0.9% FLUSH: 3.0000 mL | INTRAVENOUS | Status: DC | PRN

## 2016-12-05 MED ORDER — KETOROLAC TROMETHAMINE 30 MG/ML IJ SOLN
30.0000 mg | Freq: Once | INTRAMUSCULAR | Status: AC
  Administered 2016-12-05: 30 mg via INTRAVENOUS
  Filled 2016-12-05: qty 1

## 2016-12-05 MED ORDER — HYDROCODONE-ACETAMINOPHEN 5-325 MG PO TABS: 1.0000 | ORAL_TABLET | ORAL | 0 refills | Status: DC | PRN

## 2016-12-05 MED ORDER — HYDROCODONE-ACETAMINOPHEN 5-325 MG PO TABS: 2.0000 | ORAL_TABLET | ORAL | 0 refills | Status: DC | PRN

## 2016-12-05 MED ORDER — SODIUM CHLORIDE 0.9% FLUSH: 3.0000 mL | Freq: Two times a day (BID) | INTRAVENOUS | Status: DC

## 2016-12-05 MED ORDER — DEXAMETHASONE SODIUM PHOSPHATE 10 MG/ML IJ SOLN
10.0000 mg | Freq: Once | INTRAMUSCULAR | Status: AC
  Administered 2016-12-05: 10 mg via INTRAVENOUS
  Filled 2016-12-05: qty 1

## 2016-12-05 MED ORDER — CYCLOBENZAPRINE HCL 10 MG PO TABS: 5.0000 mg | ORAL_TABLET | Freq: Two times a day (BID) | ORAL | 0 refills | Status: DC | PRN

## 2016-12-05 MED ORDER — SODIUM CHLORIDE 0.9 % IV SOLN
250.0000 mL | INTRAVENOUS | Status: DC | PRN
  Administered 2016-12-05: 250 mL via INTRAVENOUS

## 2016-12-05 MED ORDER — PREDNISONE 10 MG (21) PO TBPK: ORAL_TABLET | Freq: Every day | ORAL | 0 refills | Status: DC

## 2016-12-05 NOTE — ED Notes (Signed)
Returned from MRI 

## 2016-12-05 NOTE — ED Provider Notes (Signed)
[facility] EMERGENCY DEPARTMENT Provider Note   CSN: [id number] Arrival date & time: 12/05/16  1014  Patient comes to the ER sent from her [facility] Primary care provider for evaluation of acute on chronic back pain, weakness to lower extremities. Loss of bladder control and difficulty with having a bowel movement. Her pain started 3-4 days ago and has been worsening. Aggravated by  Bending, testing and moving her feet. She does get some relief with rest. Denies numbness or tingling. Denies fevers, weight loss, dysuria. She had an MRI or her lumbar spine in 2016 which showed a small right paracentral disc herniation.   Pt reports a few times she has been doing something and noticed that she had urinated on herself a little bit without knowing, this has never been a problem for her before.    History   Chief Complaint Chief Complaint  Patient presents with  . Back Pain    HPI Allison Miles is a 31 y.o. female.  HPI  Past Medical History:  Diagnosis Date  . ADD (attention deficit disorder) without hyperactivity   . Anemia     Patient Active Problem List   Diagnosis Date Noted  . Postoperative state 05/25/2013  . Umbilical hernia, incarcerated 05/12/2013  . Pre-op evaluation 05/09/2013  . Hernia, umbilical 03/22/2013  . Contraceptive management 03/22/2013    Past Surgical History:  Procedure Laterality Date  . CESAREAN SECTION    . DIAGNOSTIC LAPAROSCOPY     tacked pelvic ligaments due to prolapse and removed iud  . IUD REMOVAL    . UMBILICAL HERNIA REPAIR N/A 05/12/2013   Procedure: UMBILICAL HERNIA REPAIR WITHOUT MESH;  Surgeon: Marlane Hatcher, MD;  Location: [facility];  Service: General;  Laterality: N/A;  . UTERINE SUSPENSION    . VULVECTOMY Bilateral 05/12/2013   Procedure: Labial Reduction/Labioplasty;  Surgeon: Tilda Burrow, MD;  Location: [facility];  Service: Gynecology;  Laterality: Bilateral;    OB History    Gravida Para Term  Preterm AB Living   2 2 2  0 0 2   SAB TAB Ectopic Multiple Live Births   0 0 0 0 2       Home Medications    Prior to Admission medications   Medication Sig Start Date End Date Taking? Authorizing Provider  amphetamine-dextroamphetamine (ADDERALL) 10 MG tablet Take 10 mg by mouth daily with breakfast.    [provider]  meloxicam (MOBIC) 15 MG tablet Take 15 mg by mouth as needed.     [provider]  metroNIDAZOLE (FLAGYL) 500 MG tablet Take 1 tablet (500 mg total) by mouth 2 (two) times daily. 08/27/16   Tilda Burrow, MD  NUVARING 0.12-0.015 MG/24HR vaginal ring INSERT VAGINALLY AND LEAVE IN PLACE FOR 3 CONSECUTIVE WEEKS, THEN REMOVE FOR 1 WEEK. 08/14/16   Lazaro Arms, MD  tiZANidine (ZANAFLEX) 4 MG tablet Take 4 mg by mouth as needed.     [provider]    Family History Family History  Problem Relation Age of Onset  . Stroke Paternal Grandfather        also had ms  . Cancer Maternal Grandmother        brain  . Diabetes Maternal Uncle   . Cancer Mother        oral and cervical  . Anesthesia problems Neg Hx   . Hypotension Neg Hx   . Malignant hyperthermia Neg Hx   . Pseudochol deficiency Neg Hx  Social History Social History  Substance Use Topics  . Smoking status: Current Every Day Smoker    Packs/day: 1.00    Years: 9.00    Types: Cigarettes  . Smokeless tobacco: Never Used  . Alcohol use 0.0 oz/week     Comment: occasional drinks alcohol     Allergies   Morphine   Review of Systems Review of Systems Negative ROS aside from pertinent positives and negatives as listed in HPI   Physical Exam Updated Vital Signs BP 117/66 (BP Location: Right Arm)   Pulse 68   Temp 98.2 F (36.8 C) (Oral)   Resp 14   SpO2 100%   Physical Exam  Constitutional: She appears well-developed and well-nourished. No distress.  HENT:  Head: Normocephalic and atraumatic.  Eyes: Pupils are equal, round, and reactive to light.  Neck:  Normal range of motion. Neck supple.  Cardiovascular: Normal rate and regular rhythm.   Pulmonary/Chest: Effort normal.  Abdominal: Soft.  Genitourinary:  Genitourinary Comments: Normal rectal tone  Musculoskeletal:  Bony spinal tenderness around L3-5 No paraspinal tenderness Positive right side straight leg test Unable to perform ROM due to pain  Patient ambulates slowly with a limp Patellar reflexes are 2+ on the right side and 2+ on the left side. 5/5 strength in upper and lower extremities Sensation intact in lower extremities   Neurological: She is alert.  Skin: Skin is warm and dry.  Nursing note and vitals reviewed.    ED Treatments / Results  Labs (all labs ordered are listed, but only abnormal results are displayed) Labs Reviewed  BASIC METABOLIC PANEL - Abnormal; Notable for the following:       Result Value   Calcium 8.8 (*)    All other components within normal limits  CBC  URINALYSIS, COMPLETE (UACMP) WITH MICROSCOPIC    EKG  EKG Interpretation None       Radiology Mr Lumbar Spine Wo Contrast  Result Date: 12/05/2016 CLINICAL DATA:  Initial evaluation for acute lower back pain for several days. EXAM: MRI LUMBAR SPINE WITHOUT CONTRAST TECHNIQUE: Multiplanar, multisequence MR imaging of the lumbar spine was performed. No intravenous contrast was administered. COMPARISON:  Previous MRI from 09/11/2014. FINDINGS: Segmentation: Normal segmentation. Lowest well-formed disc labeled the L5-S1 level. Alignment: Vertebral bodies normally aligned with preservation of the normal lumbar lordosis. No listhesis. Vertebrae: Vertebral body heights well maintained. No evidence for acute or chronic fracture. Small benign hemangiomas noted within the L1 and L4 vertebral bodies. No worrisome osseous lesions. Mild reactive endplate changes noted about the L5-S1 interspace. Conus medullaris: Extends to the L2 level and appears normal. Paraspinal and other soft tissues: Paraspinous  soft tissues within normal limits. Visualized visceral structures are normal. Disc levels: No significant degenerative changes are seen through the L4-5 level. L5-S1: Diffuse disc bulge with disc desiccation and intervertebral disc space narrowing. Mild reactive endplate changes. There is a new moderate to large-sized right subarticular disc protrusion extending into the right lateral recess (series 7, image 35). Disc protrusion measures 10 x 22 x 12 mm (AP by transverse by craniocaudad). Protruding disc contacts and displaces the descending right S1 nerve root posteriorly. Disc also contacts the descending left S1 nerve root without frank impingement or displacement. Central canal an foramina remain patent. IMPRESSION: 1. New moderate to large size right subarticular disc protrusion at L5-S1 with secondary right S1 nerve root impingement. 2. Otherwise stable normal MRI appearance of the lumbar spine. Electronically Signed   By: Janell Quiet.D.  On: 12/05/2016 14:05    Procedures Procedures (including critical care time)  Medications Ordered in ED Medications  sodium chloride flush (NS) 0.9 % injection 3 mL (3 mLs Intravenous Not Given 12/05/16 1153)  sodium chloride flush (NS) 0.9 % injection 3 mL (not administered)  0.9 %  sodium chloride infusion (250 mLs Intravenous New Bag/Given 12/05/16 1152)  ketorolac (TORADOL) 30 MG/ML injection 30 mg (not administered)  dexamethasone (DECADRON) injection 10 mg (10 mg Intravenous Given 12/05/16 1151)     Initial Impression / Assessment and Plan / ED Course  I have reviewed the triage vital signs and the nursing notes.  Pertinent labs & imaging results that were available during my care of the patient were reviewed by me and considered in my medical decision making (see chart for details).   11:30am As patient describes acute onset of low back pain with associated urine incontinence, difficulty with bowel movement and perceived le weakness  will need lumbar MRI on emergent basis. Will order this as well as urinalysis, bladder scanner, baseline labs (CBC & BMP). IV saline lock started and 10 mg IV Decadron.  13:06:40 ED Notes Pt. Stated that she did not have to pee prior to the exam performed at 13:06. At 13:06, 250mL urine was in her bladder.  Kirsteins, Aleksa, NT    2:16 pm: I spoke with Dr. Lovell SheehanJenkins and appreciate his help very much. He reviewed her MRI, he recommends Medrol dosepack, hydrocone and flexeril. If she calls office and lets them known she was seen in the ED and he will see her Tuesday then he will be able to see her for follow-up. MRI does not show any acute findings that warrant emergent surgical intervention.  Final Clinical Impressions(s) / ED Diagnoses   Final diagnoses:  Protrusion of lumbar intervertebral disc    New Prescriptions New Prescriptions   No medications on file     Marlon PelGreene, Payeton Germani, PA-C 12/05/16 1448    Mancel BaleWentz, Elliott, MD 12/06/16 [phone]

## 2016-12-05 NOTE — ED Notes (Signed)
Pt. Stated that she did not have to pee prior to the exam performed at 13:06. At 13:06, urine was in her bladder.

## 2016-12-05 NOTE — ED Notes (Signed)
ED Provider at bedside. 

## 2016-12-05 NOTE — ED Triage Notes (Signed)
Pt states that she has had lower back pain for several days. Pt has known back problems with MRI. Pt states that this episode she called her MD and was referred here.

## 2016-12-05 NOTE — ED Notes (Signed)
Patient transported to MRI 

## 2016-12-15 ENCOUNTER — Other Ambulatory Visit: Payer: Self-pay | Admitting: Obstetrics and Gynecology

## 2016-12-15 ENCOUNTER — Encounter: Payer: Self-pay | Admitting: Obstetrics and Gynecology

## 2016-12-15 ENCOUNTER — Ambulatory Visit: Payer: Medicaid Other | Admitting: Obstetrics and Gynecology

## 2016-12-15 VITALS — BP 130/78 | HR 95 | Ht 69.5 in | Wt 194.6 lb

## 2016-12-15 DIAGNOSIS — N870 Mild cervical dysplasia: Secondary | ICD-10-CM | POA: Diagnosis not present

## 2016-12-15 DIAGNOSIS — Z3202 Encounter for pregnancy test, result negative: Secondary | ICD-10-CM | POA: Diagnosis not present

## 2016-12-15 DIAGNOSIS — R87612 Low grade squamous intraepithelial lesion on cytologic smear of cervix (LGSIL): Secondary | ICD-10-CM | POA: Insufficient documentation

## 2016-12-15 LAB — POCT URINE PREGNANCY: Preg Test, Ur: NEGATIVE

## 2016-12-15 NOTE — Progress Notes (Signed)
Patient ID: Allison Miles, female   DOB: 05-05-1985, 31 y.o.   MRN: [id number]  [name] 31 y.o. 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 Discussed role for HPV in cervical dysplasia, need for surveillance.   Pt has had two children and notes that she is unsure if she would like to have more children. Pt would like to get herself in better shape to help her back pain. She has been thinking about water aerobics. Pt reports L5S1 compression, and is being followed by a neurosurgeon. She denies any other symptoms or complaints at this time.   Patient given informed consent, signed copy in the chart, time out was performed.  Placed in lithotomy position. Cervix viewed with speculum and colposcope after application of acetic acid.   Colposcopy adequate? Yes  HPV changes noted at 12 o'clock with possible squamous changes; biopsies obtained at 12 o'clock.   ECC specimen obtained. All specimens were labelled and sent to pathology.  Cervix was everted with post procedure changes with endocervical vasculature and gland visible.    Colposcopy IMPRESSION: 1. Biopsy at 12 o'clock 2. 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 Will follow up pathology and manage accordingly.  Routine preventative health maintenance measures emphasized.   By signing my name below, I, Diona Browner, attest that this documentation has been prepared under the direction and in the presence of Tilda Burrow, MD. Electronically Signed: Diona Browner, Medical Scribe. 12/15/16. 11:11 AM.  I personally performed the services described in this documentation, which was SCRIBED in my presence. The recorded information has been reviewed and considered accurate. It has been edited as necessary during review. Tilda Burrow, MD

## 2017-07-18 ENCOUNTER — Other Ambulatory Visit: Payer: Self-pay | Admitting: Obstetrics & Gynecology

## 2018-03-03 ENCOUNTER — Other Ambulatory Visit (HOSPITAL_COMMUNITY)
Admission: RE | Admit: 2018-03-03 | Discharge: 2018-03-03 | Disposition: A | Discharge status: Home / Self Care | Payer: Medicaid Other | Source: Ambulatory Visit | Attending: Obstetrics and Gynecology | Admitting: Obstetrics and Gynecology

## 2018-03-03 ENCOUNTER — Ambulatory Visit: Payer: Medicaid Other | Admitting: Obstetrics and Gynecology

## 2018-03-03 ENCOUNTER — Encounter: Payer: Self-pay | Admitting: Obstetrics and Gynecology

## 2018-03-03 VITALS — BP 140/86 | HR 89 | Ht 69.0 in | Wt 221.4 lb

## 2018-03-03 DIAGNOSIS — Z01419 Encounter for gynecological examination (general) (routine) without abnormal findings: Secondary | ICD-10-CM | POA: Diagnosis present

## 2018-03-03 DIAGNOSIS — Z0000 Encounter for general adult medical examination without abnormal findings: Secondary | ICD-10-CM | POA: Diagnosis not present

## 2018-03-03 MED ORDER — KETOCONAZOLE 2 % EX CREA: 1.0000 "application " | TOPICAL_CREAM | Freq: Every day | CUTANEOUS | 1 refills | Status: DC

## 2018-03-03 MED ORDER — ETONOGESTREL-ETHINYL ESTRADIOL 0.12-0.015 MG/24HR VA RING: VAGINAL_RING | VAGINAL | 11 refills | Status: DC

## 2018-03-03 NOTE — Progress Notes (Signed)
Patient ID: Allison Miles, female   DOB: January 18, 1986, 33 y.o.   MRN: [id number]  Assessment:  Annual Gyn Exam Tinea corpus, groin Plan:  1. pap smear done, next pap due 1 year 2. Rx Ketoconozole cream 3    Annual mammogram advised after age 54 Subjective:  Allison Miles is a 33 y.o. female [phone] who presents for annual exam. No LMP recorded. Patient has had an implant. The patient has complaints today of rash in groin area with odor x 1 month ago. Has been putting Aquafor on it to alleviate it. Last PAP 08/27/16 LSGIL HRHPV+. Has had several abnormal PAP and has had treatments for abnormal PAPs  Believes that she had a hard knot in left breast but is unsure.  Youngest child is 5 and has thoughts of getting her tubes tied. Is not in a long term relationship and is happy about that. Is happy with her Nuvaring  The following portions of the patient's history were reviewed and updated as appropriate: allergies, current medications, past family history, past medical history, past social history, past surgical history and problem list. Past Medical History:  Diagnosis Date  . ADD (attention deficit disorder) without hyperactivity   . Anemia   . Lumbar herniated disc     Past Surgical History:  Procedure Laterality Date  . CESAREAN SECTION    . DIAGNOSTIC LAPAROSCOPY     tacked pelvic ligaments due to prolapse and removed iud  . IUD REMOVAL    . UMBILICAL HERNIA REPAIR N/A 05/12/2013   Procedure: UMBILICAL HERNIA REPAIR WITHOUT MESH;  Surgeon: Marlane Hatcher, MD;  Location: [facility];  Service: General;  Laterality: N/A;  . UTERINE SUSPENSION    . VULVECTOMY Bilateral 05/12/2013   Procedure: Labial Reduction/Labioplasty;  Surgeon: Tilda Burrow, MD;  Location: [facility];  Service: Gynecology;  Laterality: Bilateral;     Current Outpatient Medications:  .  amphetamine-dextroamphetamine (ADDERALL) 10 MG tablet, Take 20 mg daily with breakfast by mouth. , Disp: , Rfl:  .   cyclobenzaprine (FLEXERIL) 10 MG tablet, Take 0.5-1 tablets (5-10 mg total) by mouth 2 (two) times daily as needed., Disp: 20 tablet, Rfl: 0 .  HYDROcodone-acetaminophen (NORCO/VICODIN) 5-325 MG tablet, Take 2 tablets by mouth every 4 (four) hours as needed., Disp: 10 tablet, Rfl: 0 .  meloxicam (MOBIC) 15 MG tablet, Take 15 mg by mouth as needed. , Disp: , Rfl:  .  NUVARING 0.12-0.015 MG/24HR vaginal ring, INSERT VAGINALLY AND LEAVE IN PLACE FOR 3 CONSECUTIVE WEEKS, THEN REMOVE FOR 1 WEEK., Disp: 1 each, Rfl: 11  Review of Systems Constitutional: negative Gastrointestinal: negative Genitourinary: normal  Objective:  There were no vitals taken for this visit.   BMI: There is no height or weight on file to calculate BMI.  General Appearance: Alert, appropriate appearance for age. No acute distress HEENT: Grossly normal Neck / Thyroid:  Cardiovascular: RRR; normal S1, S2, no murmur Lungs: CTA bilaterally Back: No CVAT Breast Exam:irregular fibrocystic changes firmness at 9 o'clok nothing suspicious, medial aspect with slight tenderness in left breast Gastrointestinal: Soft, non-tender, no masses or organomegaly Pelvic Exam: EXTERNAL GENITALIA: Mild redness  over 6 cm  Left groin area and 2 cm on right VAGINA: normal appearing vagina with normal color and discharge, no lesions,  CERVIX: normal,  well supported UTERUS: normal  PAP: Pap smear done today. Lymphatic Exam: Non-palpable nodes in neck, clavicular, axillary, or inguinal regions Skin: no rash or abnormalities Neurologic: Normal gait and speech, no  tremor  Psychiatric: Alert and oriented, appropriate affect. Urinalysis:trace blood  By signing my name below, I, Arnette Norris, attest that this documentation has been prepared under the direction and in the presence of Tilda Burrow, MD. Electronically Signed: Arnette Norris Medical Scribe. 03/03/18. 9:12 AM.  I personally performed the services described in this documentation,  which was SCRIBED in my presence. The recorded information has been reviewed and considered accurate. It has been edited as necessary during review. Tilda Burrow, MD

## 2018-03-03 NOTE — Patient Instructions (Signed)
endo Endometrial Ablation Endometrial ablation is a procedure that destroys the thin inner layer of the lining of the uterus (endometrium). This procedure may be done:  To stop heavy periods.  To stop bleeding that is causing anemia.  To control irregular bleeding.  To treat bleeding caused by small tumors (fibroids) in the endometrium. This procedure is often an alternative to major surgery, such as removal of the uterus and cervix (hysterectomy). As a result of this procedure:  You may not be able to have children. However, if you are premenopausal (you have not gone through menopause): ? You may still have a small chance of getting pregnant. ? You will need to use a reliable method of birth control after the procedure to prevent pregnancy.  You may stop having a menstrual period, or you may have only a small amount of bleeding during your period. Menstruation may return several years after the procedure. Tell a health care provider about:  Any allergies you have.  All medicines you are taking, including vitamins, herbs, eye drops, creams, and over-the-counter medicines.  Any problems you or family members have had with the use of anesthetic medicines.  Any blood disorders you have.  Any surgeries you have had.  Any medical conditions you have. What are the risks? Generally, this is a safe procedure. However, problems may occur, including:  A hole (perforation) in the uterus or bowel.  Infection of the uterus, bladder, or vagina.  Bleeding.  Damage to other structures or organs.  An air bubble in the lung (air embolus).  Problems with pregnancy after the procedure.  Failure of the procedure.  Decreased ability to diagnose cancer in the endometrium. What happens before the procedure?  You will have tests of your endometrium to make sure there are no pre-cancerous cells or cancer cells present.  You may have an ultrasound of the uterus.  You may be given  medicines to thin the endometrium.  Ask your health care provider about: ? Changing or stopping your regular medicines. This is especially important if you take diabetes medicines or blood thinners. ? Taking medicines such as aspirin and ibuprofen. These medicines can thin your blood. Do not take these medicines before your procedure if your doctor tells you not to.  Plan to have someone take you home from the hospital or clinic. What happens during the procedure?   You will lie on an exam table with your feet and legs supported as in a pelvic exam.  To lower your risk of infection: ? Your health care team will wash or sanitize their hands and put on germ-free (sterile) gloves. ? Your genital area will be washed with soap.  An IV tube will be inserted into one of your veins.  You will be given a medicine to help you relax (sedative).  A surgical instrument with a light and camera (resectoscope) will be inserted into your vagina and moved into your uterus. This allows your surgeon to see inside your uterus.  Endometrial tissue will be removed using one of the following methods: ? Radiofrequency. This method uses a radiofrequency-alternating electric current to remove the endometrium. ? Cryotherapy. This method uses extreme cold to freeze the endometrium. ? Heated-free liquid. This method uses a heated saltwater (saline) solution to remove the endometrium. ? Microwave. This method uses high-energy microwaves to heat up the endometrium and remove it. ? Thermal balloon. This method involves inserting a catheter with a balloon tip into the uterus. The balloon tip is filled  with heated fluid to remove the endometrium. The procedure may vary among health care providers and hospitals. What happens after the procedure?  Your blood pressure, heart rate, breathing rate, and blood oxygen level will be monitored until the medicines you were given have worn off.  As tissue healing occurs, you may  notice vaginal bleeding for 4-6 weeks after the procedure. You may also experience: ? Cramps. ? Thin, watery vaginal discharge that is light pink or brown in color. ? A need to urinate more frequently than usual. ? Nausea.  Do not drive for 24 hours if you were given a sedative.  Do not have sex or insert anything into your vagina until your health care provider approves. Summary  Endometrial ablation is done to treat the many causes of heavy menstrual bleeding.  The procedure may be done only after medications have been tried to control the bleeding.  Plan to have someone take you home from the hospital or clinic. This information is not intended to replace advice given to you by your health care provider. Make sure you discuss any questions you have with your health care provider. Document Released: 12/07/2003 Document Revised: 07/14/2017 Document Reviewed: 02/14/2016 Elsevier Interactive Patient Education  2019 [employer]

## 2018-03-08 LAB — CYTOLOGY - PAP
CHLAMYDIA, DNA PROBE: NEGATIVE
DIAGNOSIS: NEGATIVE
HPV (WINDOPATH): NOT DETECTED
Neisseria Gonorrhea: NEGATIVE

## 2018-04-20 ENCOUNTER — Ambulatory Visit (HOSPITAL_COMMUNITY): Payer: Medicaid Other | Attending: Neurosurgery | Admitting: Physical Therapy

## 2018-04-20 ENCOUNTER — Other Ambulatory Visit: Payer: Self-pay

## 2018-04-20 ENCOUNTER — Encounter (HOSPITAL_COMMUNITY): Payer: Self-pay | Admitting: Physical Therapy

## 2018-04-20 DIAGNOSIS — M5416 Radiculopathy, lumbar region: Secondary | ICD-10-CM | POA: Diagnosis present

## 2018-04-20 DIAGNOSIS — M6281 Muscle weakness (generalized): Secondary | ICD-10-CM | POA: Insufficient documentation

## 2018-04-20 NOTE — Patient Instructions (Addendum)
Backward Bend (Standing)    Arch backward to make hollow of back deeper. Hold _2___ seconds. Repeat ___5_ times per set. Do __1__ sets per session. Do __4__ sessions per day.  http://orth.exer.us/178   Copyright  VHI. All rights reserved.  On Elbows (Prone)    Rise up on elbows as high as possible, keeping hips on floor. Hold _60___ seconds. Repeat _1___ times per set. Do _1___ sets per session. Do __2__ sessions per day.  http://orth.exer.us/92   Copyright  VHI. All rights reserved.  Straight Leg Raise (Prone)    Abdomen and head supported, keep left knee locked and raise leg at hip. Avoid arching low back. Repeat _10___ times per set. Do __1__ sets per session. Do __1-2__ sessions per day.  http://orth.exer.us/1112   Copyright  VHI. All rights reserved.

## 2018-04-20 NOTE — Therapy (Signed)
[facility] [address] [place], [place], [address] Phone: [phone]   Fax:  [phone]  Physical Therapy Evaluation  Patient Details  Name: Allison Miles MRN: [id number] Date of Birth: [birth year] Referring Provider (PT): Tressie Stalker   Encounter Date: 04/20/2018  PT End of Session - 04/20/18 1027    Visit Number  1    Number of Visits  4    Date for PT Re-Evaluation  --   4th visit for medicaid renewal    Authorization Type  medicaid 2 x a week for first week ; 1 x for second week requested starting next week     PT Start Time  0955   pt late   PT Stop Time  1029    PT Time Calculation (min)  34 min    Activity Tolerance  Patient tolerated treatment well    Behavior During Therapy  [facility] for tasks assessed/performed       Past Medical History:  Diagnosis Date  . ADD (attention deficit disorder) without hyperactivity   . Anemia   . Lumbar herniated disc     Past Surgical History:  Procedure Laterality Date  . CESAREAN SECTION    . DIAGNOSTIC LAPAROSCOPY     tacked pelvic ligaments due to prolapse and removed iud  . IUD REMOVAL    . UMBILICAL HERNIA REPAIR N/A 05/12/2013   Procedure: UMBILICAL HERNIA REPAIR WITHOUT MESH;  Surgeon: Marlane Hatcher, MD;  Location: [facility];  Service: General;  Laterality: N/A;  . UTERINE SUSPENSION    . VULVECTOMY Bilateral 05/12/2013   Procedure: Labial Reduction/Labioplasty;  Surgeon: Tilda Burrow, MD;  Location: [facility];  Service: Gynecology;  Laterality: Bilateral;    There were no vitals filed for this visit.   Subjective Assessment - 04/20/18 1025    Subjective  Ms. Bourque states that she has been having low back pain for seven years.  She fell off the bed onto a glass and has had pain all the time but it increases in severity.  Her pain is across her low back that occasionally goes down her right leg to her feet on a daily.      Pertinent History  LT knee pain     Limitations  Sitting;Lifting;Standing;House hold activities    How long can you sit comfortably?  tends to shift a lot;  hard surfaces less than five minutes, Difficult to be in the car longer than 30 minutes.     How long can you stand comfortably?  shifts her weight constantly.      How long can you walk comfortably?  immediate pain     Patient Stated Goals  less pain     Pain Onset  More than a month ago         [facility] PT Assessment - 04/20/18 0001      Assessment   Medical Diagnosis  lumbar radiculopathy    Referring Provider (PT)  Tressie Stalker    Onset Date/Surgical Date  --   chronic 7 years ago.    Next MD Visit  not scheduled     Prior Therapy  none      Precautions   Precautions  None      Restrictions   Weight Bearing Restrictions  No      Balance Screen   Has the patient fallen in the past 6 months  No    Has the patient had a decrease in activity  level because of a fear of falling?   Yes    Is the patient reluctant to leave their home because of a fear of falling?   No      Home Environment   Living Environment  Private residence    Type of Home  House      Prior Function   Level of Independence  Independent    Vocation  Full time employment    Vocation Requirements  hairdresser/ waitress     Leisure  playing with children       Cognition   Overall Cognitive Status  Within Functional Limits for tasks assessed      Functional Tests   Functional tests  Single leg stance;Sit to Stand      Single Leg Stance   Comments  LT 35; RT 43      Sit to Stand   Comments  5 x 18.38      Posture/Postural Control   Posture/Postural Control  No significant limitations      ROM / Strength   AROM / PROM / Strength  AROM;Strength      AROM   AROM Assessment Site  Lumbar    Lumbar Flexion  wfl    reps in crease pain    Lumbar Extension  wfl    reps decrease pain      Strength   Strength Assessment Site  Hip;Knee;Ankle    Right/Left Hip  Right;Left    Right  Hip Flexion  3+/5    Right Hip Extension  3/5    Right Hip ABduction  4+/5    Left Hip Flexion  4+/5    Left Hip Extension  3/5    Left Hip ABduction  5/5    Right/Left Knee  Right;Left    Right Knee Flexion  4+/5    Right Knee Extension  3+/5    Left Knee Flexion  5/5    Left Knee Extension  5/5    Right/Left Ankle  Right;Left    Right Ankle Dorsiflexion  3+/5    Left Ankle Dorsiflexion  5/5                Objective measurements completed on examination: See above findings.      [facility] Adult PT Treatment/Exercise - 04/20/18 0001      Exercises   Exercises  Lumbar      Lumbar Exercises: Stretches   Standing Extension  5 reps    Prone on Elbows Stretch  1 rep;30 seconds      Lumbar Exercises: Prone   Straight Leg Raise  10 reps             PT Education - 04/20/18 1026    Education Details  hep     Person(s) Educated  Patient    Methods  Explanation;Verbal cues;Handout;Demonstration    Comprehension  Verbalized understanding;Returned demonstration       PT Short Term Goals - 04/20/18 1202      PT SHORT TERM GOAL #1   Title  Pt pain to go no further than her knee level to demonstrate decreased nerve irritaion     Time  3    Period  Weeks    Status  New    Target Date  05/04/18   Pt will not start until next week due to insurance authorization.      PT SHORT TERM GOAL #2   Title  PT Rt LE strength to increase to allow pt  to go up and down steps in a reciprocal manner without difficulty     Time  3    Period  Weeks    Status  New      PT SHORT TERM GOAL #3   Title  PT Pain to be no greater than a 7/10 to allow pt to be waking at night only one time per night     Time  3    Period  Weeks    Status  New        PT Long Term Goals - 04/20/18 1204      PT LONG TERM GOAL #1   Title  PT to have no radicular sx to demonstrate the ability of pt to keep pressure off of her nerve root     Time  6    Period  Weeks    Status  New    Target Date   06/08/18      PT LONG TERM GOAL #2   Title  PT core and LE strength to improve to allow pt to get up off the floor after playing with her children without difficulty     Time  6    Period  Weeks    Status  New      PT LONG TERM GOAL #3   Title  PT pain in her back to be no greater than a 4/10 to allow pt to only be waking at night from back pain no greater than 2 x a week .Marland Kitchen.    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  She has been having chronic back pain which has progressed in nature.  MRI shows positive displacement of L5S1 disc.  Pt had been referred to skilled PT>  Evaluation demonstrates decreased knowledege of body mechanics, increased pain, decreased activity tolerance and decreased strength.  Allison Miles will benefit from skilled PT to address these issues and maximize her functional ability.     Examination-Activity Limitations  Bed Mobility;Bend;Carry;Lift;Hygiene/Grooming;Sleep;Squat;Stairs;Stand    Examination-Participation Restrictions  Cleaning;Community Activity;Yard Work;Other;Laundry    Clinical Decision Making  Low    Rehab Potential  Good    PT Frequency  2x / week    PT Duration  6 weeks    PT Treatment/Interventions  ADLs/Self Care Home Management;Aquatic Therapy;Traction;Functional mobility training;Therapeutic activities;Therapeutic exercise;Patient/family education;Manual techniques;Dry needling    PT Next Visit Plan  Educate on proper bed mobility and lifting, begin hamstring stretching and knee to chest as well as stabilization.  If pain does not decrease may try mechanical traction and manual     PT Home Exercise Plan  standing extension, POE and prone SLR    Consulted and Agree with Plan of Care  Patient       Patient will benefit from skilled therapeutic intervention in order to improve the following deficits and  impairments:  Decreased activity tolerance, Decreased balance, Decreased strength, Pain, Improper body mechanics  Visit Diagnosis: Radiculopathy, lumbar region - Plan: PT plan of care cert/re-cert  Muscle weakness (generalized) - Plan: PT plan of care cert/re-cert     Problem List Patient Active Problem List   Diagnosis Date Noted  . 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 Postoperative state 05/25/2013  . Umbilical hernia, incarcerated 05/12/2013  . Pre-op evaluation 05/09/2013  . Hernia, umbilical 03/22/2013  . Contraceptive management 03/22/2013   Virgina Organ, PT CLT [phone] 04/20/2018, 12:15 PM  [facility] [address] [place], [place], [address] Phone: [phone]   Fax:  [phone]  Name: Allison Miles MRN: [id number] Date of Birth: [birth year]

## 2018-04-28 ENCOUNTER — Ambulatory Visit (HOSPITAL_COMMUNITY): Payer: Medicaid Other | Admitting: Physical Therapy

## 2018-04-28 ENCOUNTER — Telehealth (HOSPITAL_COMMUNITY): Payer: Self-pay | Admitting: Physical Therapy

## 2018-04-28 NOTE — Telephone Encounter (Signed)
No show #1:  Left message letting pt know that her insurance had approved 3 visits from 3/16-3/29.    Virgina Organ, PT CLT [phone]

## 2019-01-21 ENCOUNTER — Other Ambulatory Visit: Payer: Self-pay

## 2019-01-21 DIAGNOSIS — Z20822 Contact with and (suspected) exposure to covid-19: Secondary | ICD-10-CM

## 2019-01-22 LAB — NOVEL CORONAVIRUS, NAA: SARS-CoV-2, NAA: NOT DETECTED

## 2019-02-01 ENCOUNTER — Other Ambulatory Visit: Payer: Medicaid Other

## 2019-02-02 ENCOUNTER — Other Ambulatory Visit: Payer: Self-pay

## 2019-02-02 ENCOUNTER — Ambulatory Visit: Payer: Medicaid Other | Attending: Family Medicine

## 2019-02-02 DIAGNOSIS — Z20822 Contact with and (suspected) exposure to covid-19: Secondary | ICD-10-CM

## 2019-02-03 LAB — NOVEL CORONAVIRUS, NAA: SARS-CoV-2, NAA: NOT DETECTED

## 2019-05-20 ENCOUNTER — Emergency Department (HOSPITAL_COMMUNITY): Payer: Medicaid Other

## 2019-05-20 ENCOUNTER — Inpatient Hospital Stay (HOSPITAL_COMMUNITY)
Admission: EM | Admit: 2019-05-20 | Discharge: 2019-05-22 | DRG: 758 | Disposition: A | Discharge status: Home / Self Care | Payer: Medicaid Other | Attending: Obstetrics and Gynecology | Admitting: Obstetrics and Gynecology

## 2019-05-20 ENCOUNTER — Other Ambulatory Visit: Payer: Self-pay

## 2019-05-20 ENCOUNTER — Encounter (HOSPITAL_COMMUNITY): Payer: Self-pay

## 2019-05-20 DIAGNOSIS — N7093 Salpingitis and oophoritis, unspecified: Secondary | ICD-10-CM

## 2019-05-20 DIAGNOSIS — B9620 Unspecified Escherichia coli [E. coli] as the cause of diseases classified elsewhere: Secondary | ICD-10-CM | POA: Diagnosis present

## 2019-05-20 DIAGNOSIS — N390 Urinary tract infection, site not specified: Secondary | ICD-10-CM | POA: Diagnosis present

## 2019-05-20 DIAGNOSIS — N730 Acute parametritis and pelvic cellulitis: Secondary | ICD-10-CM | POA: Diagnosis present

## 2019-05-20 DIAGNOSIS — F17210 Nicotine dependence, cigarettes, uncomplicated: Secondary | ICD-10-CM | POA: Diagnosis present

## 2019-05-20 DIAGNOSIS — N7003 Acute salpingitis and oophoritis: Secondary | ICD-10-CM | POA: Diagnosis not present

## 2019-05-20 DIAGNOSIS — N739 Female pelvic inflammatory disease, unspecified: Secondary | ICD-10-CM | POA: Diagnosis not present

## 2019-05-20 DIAGNOSIS — Z20822 Contact with and (suspected) exposure to covid-19: Secondary | ICD-10-CM | POA: Diagnosis present

## 2019-05-20 DIAGNOSIS — F988 Other specified behavioral and emotional disorders with onset usually occurring in childhood and adolescence: Secondary | ICD-10-CM | POA: Diagnosis present

## 2019-05-20 DIAGNOSIS — Z79899 Other long term (current) drug therapy: Secondary | ICD-10-CM | POA: Diagnosis not present

## 2019-05-20 LAB — COMPREHENSIVE METABOLIC PANEL
ALT: 10 U/L (ref 0–44)
AST: 10 U/L — ABNORMAL LOW (ref 15–41)
Albumin: 4.3 g/dL (ref 3.5–5.0)
Alkaline Phosphatase: 55 U/L (ref 38–126)
Anion gap: 8 (ref 5–15)
BUN: 8 mg/dL (ref 6–20)
CO2: 26 mmol/L (ref 22–32)
Calcium: 9.2 mg/dL (ref 8.9–10.3)
Chloride: 102 mmol/L (ref 98–111)
Creatinine, Ser: 0.83 mg/dL (ref 0.44–1.00)
GFR calc Af Amer: >60 mL/min (ref >60)
GFR calc non Af Amer: >60 mL/min (ref >60)
Glucose, Bld: 102 mg/dL — ABNORMAL HIGH (ref 70–99)
Potassium: 4 mmol/L (ref 3.5–5.1)
Sodium: 136 mmol/L (ref 135–145)
Total Bilirubin: 0.6 mg/dL (ref 0.3–1.2)
Total Protein: 7.5 g/dL (ref 6.5–8.1)

## 2019-05-20 LAB — CBC
HCT: 43.5 % (ref 36.0–46.0)
Hemoglobin: 14.1 g/dL (ref 12.0–15.0)
MCH: 30.9 pg (ref 26.0–34.0)
MCHC: 32.4 g/dL (ref 30.0–36.0)
MCV: 95.2 fL (ref 80.0–100.0)
Platelets: 303 10*3/uL (ref 150–400)
RBC: 4.57 MIL/uL (ref 3.87–5.11)
RDW: 13.6 % (ref 11.5–15.5)
WBC: 13.2 10*3/uL — ABNORMAL HIGH (ref 4.0–10.5)
nRBC: 0 % (ref 0.0–0.2)

## 2019-05-20 LAB — URINALYSIS, ROUTINE W REFLEX MICROSCOPIC
Bilirubin Urine: NEGATIVE
Glucose, UA: NEGATIVE mg/dL
Ketones, ur: NEGATIVE mg/dL
Leukocytes,Ua: NEGATIVE
Nitrite: POSITIVE — AB
Protein, ur: NEGATIVE mg/dL
Specific Gravity, Urine: 1.018 (ref 1.005–1.030)
pH: 7 (ref 5.0–8.0)

## 2019-05-20 LAB — LIPASE, BLOOD: Lipase: 18 U/L (ref 11–51)

## 2019-05-20 LAB — POC URINE PREG, ED: Preg Test, Ur: NEGATIVE

## 2019-05-20 LAB — WET PREP, GENITAL
Sperm: NONE SEEN
Trich, Wet Prep: NONE SEEN
Yeast Wet Prep HPF POC: NONE SEEN

## 2019-05-20 LAB — HIV ANTIBODY (ROUTINE TESTING W REFLEX): HIV Screen 4th Generation wRfx: NONREACTIVE

## 2019-05-20 MED ORDER — DICYCLOMINE HCL 10 MG PO CAPS
20.0000 mg | ORAL_CAPSULE | Freq: Once | ORAL | Status: AC
  Administered 2019-05-20: 12:00:00 20 mg via ORAL
  Filled 2019-05-20: qty 2

## 2019-05-20 MED ORDER — ACETAMINOPHEN 325 MG PO TABS
650.0000 mg | ORAL_TABLET | Freq: Once | ORAL | Status: AC
  Administered 2019-05-20: 650 mg via ORAL
  Filled 2019-05-20: qty 2

## 2019-05-20 MED ORDER — METRONIDAZOLE 500 MG PO TABS
500.0000 mg | ORAL_TABLET | Freq: Once | ORAL | Status: AC
  Administered 2019-05-20: 500 mg via ORAL
  Filled 2019-05-20: qty 1

## 2019-05-20 MED ORDER — ONDANSETRON HCL 4 MG/2ML IJ SOLN: 4.0000 mg | Freq: Four times a day (QID) | INTRAMUSCULAR | Status: DC | PRN

## 2019-05-20 MED ORDER — SODIUM CHLORIDE 0.9 % IV SOLN: INTRAVENOUS | Status: DC

## 2019-05-20 MED ORDER — POLYETHYLENE GLYCOL 3350 17 G PO PACK: 17.0000 g | PACK | Freq: Every day | ORAL | Status: DC | PRN

## 2019-05-20 MED ORDER — SODIUM CHLORIDE 0.9 % IV SOLN
1.0000 g | Freq: Two times a day (BID) | INTRAVENOUS | Status: DC
  Administered 2019-05-21 – 2019-05-22 (×4): 1 g via INTRAVENOUS
  Filled 2019-05-20 (×6): qty 1

## 2019-05-20 MED ORDER — FENTANYL CITRATE (PF) 100 MCG/2ML IJ SOLN: 100.0000 ug | INTRAMUSCULAR | Status: DC | PRN

## 2019-05-20 MED ORDER — IBUPROFEN 600 MG PO TABS
600.0000 mg | ORAL_TABLET | Freq: Four times a day (QID) | ORAL | Status: DC | PRN
  Administered 2019-05-22: 600 mg via ORAL
  Filled 2019-05-20: qty 1

## 2019-05-20 MED ORDER — CEFTRIAXONE SODIUM 500 MG IJ SOLR: 500.0000 mg | Freq: Once | INTRAMUSCULAR | Status: DC

## 2019-05-20 MED ORDER — BUTORPHANOL TARTRATE 1 MG/ML IJ SOLN: 1.0000 mg | INTRAMUSCULAR | Status: DC | PRN

## 2019-05-20 MED ORDER — DEXTROSE 5 % IV SOLN
500.0000 mg | Freq: Once | INTRAVENOUS | Status: DC
  Administered 2019-05-20: 500 mg via INTRAVENOUS
  Filled 2019-05-20: qty 500

## 2019-05-20 MED ORDER — DOXYCYCLINE HYCLATE 100 MG PO TABS
100.0000 mg | ORAL_TABLET | Freq: Once | ORAL | Status: AC
  Administered 2019-05-20: 100 mg via ORAL
  Filled 2019-05-20: qty 1

## 2019-05-20 MED ORDER — SODIUM CHLORIDE 0.9% FLUSH
3.0000 mL | Freq: Once | INTRAVENOUS | Status: AC
  Administered 2019-05-20: 3 mL via INTRAVENOUS

## 2019-05-20 MED ORDER — ONDANSETRON HCL 4 MG PO TABS
4.0000 mg | ORAL_TABLET | Freq: Four times a day (QID) | ORAL | Status: DC | PRN
  Administered 2019-05-21: 4 mg via ORAL
  Filled 2019-05-20: qty 1

## 2019-05-20 MED ORDER — OXYCODONE-ACETAMINOPHEN 5-325 MG PO TABS
1.0000 | ORAL_TABLET | ORAL | Status: DC | PRN
  Administered 2019-05-20 – 2019-05-21 (×2): 2 via ORAL
  Administered 2019-05-21: 1 via ORAL
  Filled 2019-05-20: qty 2
  Filled 2019-05-20: qty 1
  Filled 2019-05-20: qty 2

## 2019-05-20 MED ORDER — ENOXAPARIN SODIUM 40 MG/0.4ML ~~LOC~~ SOLN
40.0000 mg | SUBCUTANEOUS | Status: DC
  Administered 2019-05-21 – 2019-05-22 (×2): 40 mg via SUBCUTANEOUS
  Filled 2019-05-20 (×2): qty 0.4

## 2019-05-20 MED ORDER — PRENATAL MULTIVITAMIN CH
1.0000 | ORAL_TABLET | Freq: Every day | ORAL | Status: DC
  Administered 2019-05-21 – 2019-05-22 (×2): 1 via ORAL
  Filled 2019-05-20 (×3): qty 1

## 2019-05-20 MED ORDER — IOHEXOL 300 MG/ML  SOLN
100.0000 mL | Freq: Once | INTRAMUSCULAR | Status: AC | PRN
  Administered 2019-05-20: 12:00:00 100 mL via INTRAVENOUS

## 2019-05-20 MED ORDER — METRONIDAZOLE IN NACL 5-0.79 MG/ML-% IV SOLN
500.0000 mg | Freq: Four times a day (QID) | INTRAVENOUS | Status: DC
  Administered 2019-05-21 – 2019-05-22 (×7): 500 mg via INTRAVENOUS
  Filled 2019-05-20 (×7): qty 100

## 2019-05-20 NOTE — ED Triage Notes (Addendum)
Pt reports constant lower abdominal for 3 days. Pt reports that she has noticed increase in urination and now she has been having difficulty having a BM for 3 days. Pt states she does have a clear, non odorous vaginal discharge

## 2019-05-20 NOTE — Consult Note (Signed)
Reason for Consult:suspected left TOA with pyosalpinx Referring Physician: Estell Harpin, MD  Allison Miles is an 34 y.o. female, LOMP 2 wk ago, sexually active with new partner x 2 months, who denies any pain til the last few days, with low grade fever 99-100.2. Nausea. Normal bm.  CT and u/s indicate a left TOA with pyosalpinx. Pt admitted for IV abx and may be candidate for drainage, depending on response.Marland Kitchen   Pertinent Gynecological History: Menses: flow is moderate Bleeding:  Contraception: condoms DES exposure: unknown Blood transfusions: none Sexually transmitted diseases: no past history Previous GYN Procedures: labioplasty 2015  Last mammogram:  Date:  Last pap: normal Date: 03/03/2018 OB History: G G2P2002    Menstrual History: Menarche age:  Patient's last menstrual period was 05/08/2019.    Past Medical History:  Diagnosis Date  . ADD (attention deficit disorder) without hyperactivity   . Anemia   . Lumbar herniated disc     Past Surgical History:  Procedure Laterality Date  . CESAREAN SECTION    . DIAGNOSTIC LAPAROSCOPY     tacked pelvic ligaments due to prolapse and removed iud  . IUD REMOVAL    . UMBILICAL HERNIA REPAIR N/A 05/12/2013   Procedure: UMBILICAL HERNIA REPAIR WITHOUT MESH;  Surgeon: Marlane Hatcher, MD;  Location: [facility];  Service: General;  Laterality: N/A;  . UTERINE SUSPENSION    . VULVECTOMY Bilateral 05/12/2013   Procedure: Labial Reduction/Labioplasty;  Surgeon: Tilda Burrow, MD;  Location: [facility];  Service: Gynecology;  Laterality: Bilateral;    Family History  Problem Relation Age of Onset  . Stroke Paternal Grandfather        also had ms  . Cancer Maternal Grandmother        brain  . Diabetes Maternal Uncle   . Cancer Mother        oral and cervical  . Anesthesia problems Neg Hx   . Hypotension Neg Hx   . Malignant hyperthermia Neg Hx   . Pseudochol deficiency Neg Hx     Social History:  reports that she has been smoking  cigarettes. She has a 9.00 pack-year smoking history. She has never used smokeless tobacco. She reports current drug use. Drug: Marijuana. She reports that she does not drink alcohol.  Allergies:  Allergies  Allergen Reactions  . Morphine Other (See Comments)    anxious    Medications: I have reviewed the patient's current medications.  Review of Systems  Blood pressure 124/78, pulse 82, temperature 98.7 F (37.1 C), temperature source Oral, resp. rate 16, height 5\' 9"  (1.753 m), weight 93.9 kg, last menstrual period 05/08/2019, SpO2 97 %. Physical Exam  Constitutional: She is oriented to person, place, and time. She appears well-developed and well-nourished. No distress.  HENT:  Head: Normocephalic and atraumatic.  Eyes: Pupils are equal, round, and reactive to light.  Cardiovascular: Normal rate.  Respiratory: Effort normal.  GI: Soft. She exhibits no mass. There is abdominal tenderness. There is guarding. There is no rebound.  LLQ Pain and mild guarding.  Genitourinary:    Genitourinary Comments: Done by ED provider, not repeated.   Musculoskeletal:        General: Normal range of motion.     Cervical back: Neck supple.  Neurological: She is alert and oriented to person, place, and time.  Skin: Skin is warm and dry.  Psychiatric: She has a normal mood and affect. Her behavior is normal. Judgment and thought content normal.    Results for  orders placed or performed during the hospital encounter of 05/20/19 (from the past 48 hour(s))  Urinalysis, Routine w reflex microscopic     Status: Abnormal   Collection Time: 05/20/19 10:37 AM  Result Value Ref Range   Color, Urine YELLOW YELLOW   APPearance HAZY (A) CLEAR   Specific Gravity, Urine 1.018 1.005 - 1.030   pH 7.0 5.0 - 8.0   Glucose, UA NEGATIVE NEGATIVE mg/dL   Hgb urine dipstick MODERATE (A) NEGATIVE   Bilirubin Urine NEGATIVE NEGATIVE   Ketones, ur NEGATIVE NEGATIVE mg/dL   Protein, ur NEGATIVE NEGATIVE mg/dL    Nitrite POSITIVE (A) NEGATIVE   Leukocytes,Ua NEGATIVE NEGATIVE   RBC / HPF 11-20 0 - 5 RBC/hpf   WBC, UA 11-20 0 - 5 WBC/hpf   Bacteria, UA MANY (A) NONE SEEN   Squamous Epithelial / LPF 6-10 0 - 5   Mucus PRESENT    Hyaline Casts, UA PRESENT     Comment: Performed at [facility], [address], [place], [place] [address]  Lipase, blood     Status: None   Collection Time: 05/20/19 11:20 AM  Result Value Ref Range   Lipase 18 11 - 51 U/L    Comment: Performed at [facility], [address], [place], [place] [address]  Comprehensive metabolic panel     Status: Abnormal   Collection Time: 05/20/19 11:20 AM  Result Value Ref Range   Sodium 136 135 - 145 mmol/L   Potassium 4.0 3.5 - 5.1 mmol/L   Chloride 102 98 - 111 mmol/L   CO2 26 22 - 32 mmol/L   Glucose, Bld 102 (H) 70 - 99 mg/dL    Comment: Glucose reference range applies only to samples taken after fasting for at least 8 hours.   BUN 8 6 - 20 mg/dL   Creatinine, Ser 5.95 0.44 - 1.00 mg/dL   Calcium 9.2 8.9 - 63.8 mg/dL   Total Protein 7.5 6.5 - 8.1 g/dL   Albumin 4.3 3.5 - 5.0 g/dL   AST 10 (L) 15 - 41 U/L   ALT 10 0 - 44 U/L   Alkaline Phosphatase 55 38 - 126 U/L   Total Bilirubin 0.6 0.3 - 1.2 mg/dL   GFR calc non Af Amer >60 >60 mL/min   GFR calc Af Amer >60 >60 mL/min   Anion gap 8 5 - 15    Comment: Performed at [facility], [address] [place], [place] [address]  CBC     Status: Abnormal   Collection Time: 05/20/19 11:20 AM  Result Value Ref Range   WBC 13.2 (H) 4.0 - 10.5 K/uL   RBC 4.57 3.87 - 5.11 MIL/uL   Hemoglobin 14.1 12.0 - 15.0 g/dL   HCT 32.9 51.8 - 84.1 %   MCV 95.2 80.0 - 100.0 fL   MCH 30.9 26.0 - 34.0 pg   MCHC 32.4 30.0 - 36.0 g/dL   RDW 66.0 63.0 - 16.0 %   Platelets 303 150 - 400 K/uL   nRBC 0.0 0.0 - 0.2 %    Comment: Performed at [facility], [address] [place], [place] [address]  POC urine preg, ED     Status: None   Collection Time: 05/20/19 11:53 AM  Result Value Ref Range    Preg Test, Ur NEGATIVE NEGATIVE    Comment:        THE SENSITIVITY OF THIS METHODOLOGY IS >24 mIU/mL   Wet prep, genital     Status:  Abnormal   Collection Time: 05/20/19 12:58 PM   Specimen: PATH Cytology Cervicovaginal Ancillary Only  Result Value Ref Range   Yeast Wet Prep HPF POC NONE SEEN NONE SEEN   Trich, Wet Prep NONE SEEN NONE SEEN   Clue Cells Wet Prep HPF POC PRESENT (A) NONE SEEN   WBC, Wet Prep HPF POC FEW (A) NONE SEEN   Sperm NONE SEEN     Comment: Performed at [facility], [address] [place], [place] [address]    US Transvaginal Non-OB  Result Date: 05/20/2019 CLINICAL DATA:  Lower abdominal pain with concerning CT finding, suggested ultrasound follow-up. Pain for 3 days. EXAM: TRANSABDOMINAL AND TRANSVAGINAL ULTRASOUND OF PELVIS DOPPLER ULTRASOUND OF OVARIES TECHNIQUE: Both transabdominal and transvaginal ultrasound examinations of the pelvis were performed. Transabdominal technique was performed for global imaging of the pelvis including uterus, ovaries, adnexal regions, and pelvic cul-de-sac. It was necessary to proceed with endovaginal exam following the transabdominal exam to visualize the uterus and adnexa. Color and duplex Doppler ultrasound was utilized to evaluate blood flow to the ovaries. COMPARISON:  CT study of 05/20/2019 FINDINGS: Uterus Measurements: 9.9 x 5.2 x 5.5 cm = volume: 150 mL. No fibroids or other mass visualized. Endometrium Thickness: 11 mm.  No focal abnormality visualized. Right ovary Measurements: 4.3 x 2.0 x 2.5 cm = volume: 11.2 mL. Normal appearance/no adnexal mass. Left ovary Measurements: 4.0 x 4.9 x 4.6 cm = volume: 47 mL. Complex appearing cystic area within the ovary showing heterogeneity. This measures 3.3x 1.1 x 3.0 cm Septated elongated tubular structure extending to the left adnexa. Pulsed Doppler evaluation of both ovaries demonstrates normal low-resistance arterial and venous waveforms. Increased flow in the left parametrial region and  adnexa. Other findings No free fluid visualized though there is fluid in the cul-de-sac on the recent CT. IMPRESSION: 1. Findings tubo-ovarian complex with pyosalpinx. Electronically Signed   By: Zetta Bills M.D.   On: 05/20/2019 14:19   US Pelvis Complete  Result Date: 05/20/2019 CLINICAL DATA:  Lower abdominal pain with concerning CT finding, suggested ultrasound follow-up. Pain for 3 days. EXAM: TRANSABDOMINAL AND TRANSVAGINAL ULTRASOUND OF PELVIS DOPPLER ULTRASOUND OF OVARIES TECHNIQUE: Both transabdominal and transvaginal ultrasound examinations of the pelvis were performed. Transabdominal technique was performed for global imaging of the pelvis including uterus, ovaries, adnexal regions, and pelvic cul-de-sac. It was necessary to proceed with endovaginal exam following the transabdominal exam to visualize the uterus and adnexa. Color and duplex Doppler ultrasound was utilized to evaluate blood flow to the ovaries. COMPARISON:  CT study of 05/20/2019 FINDINGS: Uterus Measurements: 9.9 x 5.2 x 5.5 cm = volume: 150 mL. No fibroids or other mass visualized. Endometrium Thickness: 11 mm.  No focal abnormality visualized. Right ovary Measurements: 4.3 x 2.0 x 2.5 cm = volume: 11.2 mL. Normal appearance/no adnexal mass. Left ovary Measurements: 4.0 x 4.9 x 4.6 cm = volume: 47 mL. Complex appearing cystic area within the ovary showing heterogeneity. This measures 3.3x 1.1 x 3.0 cm Septated elongated tubular structure extending to the left adnexa. Pulsed Doppler evaluation of both ovaries demonstrates normal low-resistance arterial and venous waveforms. Increased flow in the left parametrial region and adnexa. Other findings No free fluid visualized though there is fluid in the cul-de-sac on the recent CT. IMPRESSION: 1. Findings tubo-ovarian complex with pyosalpinx. Electronically Signed   By: Zetta Bills M.D.   On: 05/20/2019 14:19   CT ABDOMEN PELVIS W CONTRAST  Result Date: 05/20/2019 CLINICAL DATA:   Low abdominal  pain for 3 weeks with constipation. Fever. Evaluate for diverticulitis, abscess and small-bowel obstruction. EXAM: CT ABDOMEN AND PELVIS WITH CONTRAST TECHNIQUE: Multidetector CT imaging of the abdomen and pelvis was performed using the standard protocol following bolus administration of intravenous contrast. CONTRAST:  100mL OMNIPAQUE IOHEXOL 300 MG/ML  SOLN COMPARISON:  Limited correlation made with lumbar MRI 12/05/2016 and pelvic ultrasound 02/13/2009. FINDINGS: Lower chest: Clear lung bases. No significant pleural or pericardial effusion. Hepatobiliary: The liver is normal in density without suspicious focal abnormality. No evidence of gallstones, gallbladder wall thickening or biliary dilatation. Pancreas: Unremarkable. No pancreatic ductal dilatation or surrounding inflammatory changes. Spleen: Normal in size without focal abnormality. Adrenals/Urinary Tract: Both adrenal glands appear normal. The kidneys appear normal without evidence mass lesion, urinary tract calculus or hydronephrosis. The bladder appears unremarkable for its degree of distention. Stomach/Bowel: No evidence of bowel wall thickening, distention or surrounding inflammatory change. No enteric contrast was administered. The appendix appears normal. Vascular/Lymphatic: There are no enlarged abdominal or pelvic lymph nodes. No significant vascular findings. 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 There is a thick-walled tubular fluid collection in the left adnexa, measuring 8.1 x 3.2 cm on image 64/2. Inferior to this, there is a complex left adnexal lesion measuring 3.9 x 3.5 cm on image 68/2. The right ovary appears normal. A small amount of focal fluid in the cul-de-sac measures 3.5 x 2.1 cm on image 66/2 and demonstrates peripheral enhancement. There are inflammatory changes in the surrounding pelvic fat, and these findings are  suspicious for pelvic inflammatory disease. Other: Small umbilical hernia containing only fat. No generalized ascites, free air or other focal extraluminal fluid collection. Musculoskeletal: No acute or significant osseous findings. Degenerative disc disease at L5-S1. IMPRESSION: 1. Complex left adnexal lesions with surrounding inflammatory changes, suspicious for pelvic inflammatory disease/tubo-ovarian abscess. Ovarian torsion not completely excluded. Recommend further evaluation with pelvic ultrasound. 2. No evidence of diverticulitis, bowel obstruction or perforation. The appendix appears normal. 3. 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 Electronically Signed   By: Carey BullocksWilliam  Veazey M.D.   On: 05/20/2019 12:43   US Art/Ven Flow Abd Pelv Doppler  Result Date: 05/20/2019 CLINICAL DATA:  Lower abdominal pain with concerning CT finding, suggested ultrasound follow-up. Pain for 3 days. EXAM: TRANSABDOMINAL AND TRANSVAGINAL ULTRASOUND OF PELVIS DOPPLER ULTRASOUND OF OVARIES TECHNIQUE: Both transabdominal and transvaginal ultrasound examinations of the pelvis were performed. Transabdominal technique was performed for global imaging of the pelvis including uterus, ovaries, adnexal regions, and pelvic cul-de-sac. It was necessary to proceed with endovaginal exam following the transabdominal exam to visualize the uterus and adnexa. Color and duplex Doppler ultrasound was utilized to evaluate blood flow to the ovaries. COMPARISON:  CT study of 05/20/2019 FINDINGS: Uterus Measurements: 9.9 x 5.2 x 5.5 cm = volume: 150 mL. No fibroids or other mass visualized. Endometrium Thickness: 11 mm.  No focal abnormality visualized. Right ovary Measurements: 4.3 x 2.0 x 2.5 cm = volume: 11.2 mL. Normal appearance/no adnexal mass. Left ovary Measurements: 4.0 x 4.9 x 4.6 cm = volume: 47 mL. Complex appearing cystic area within the ovary showing heterogeneity. This measures 3.3x 1.1 x 3.0 cm Septated elongated tubular  structure extending to the left adnexa. Pulsed Doppler evaluation of both ovaries demonstrates normal low-resistance arterial and venous waveforms. Increased flow in the left parametrial region and adnexa. Other findings No free fluid visualized though there is fluid in the cul-de-sac on the  recent CT. IMPRESSION: 1. Findings tubo-ovarian complex with pyosalpinx. Electronically Signed   By: Donzetta Kohut M.D.   On: 05/20/2019 14:19    Assessment/Plan: PID with left TOA and pyosalpinx. Admit for IV abx therapy, zosyn flagyl  Tilda Burrow 05/20/2019

## 2019-05-20 NOTE — ED Provider Notes (Addendum)
[facility] EMERGENCY DEPARTMENT Provider Note   CSN: [id number] Arrival date & time: 05/20/19  4496     History Chief Complaint  Patient presents with   Abdominal Pain    Allison Miles is a 34 y.o. female.  HPI  Patient is a 34 year old female with a past surgical history notable for C-section, ventral hernia repair 5/10 years ago, uterine surgery for uterine suspension, vulvectomy, also with history of complication of IUD were implanted in her abdomen and surgically removed in 2011.  With history of LGSIL of cervix who had a colposcopy done and had normal Pap smears since then.   Patient reports constant crampy lower abdominal pain for 3 days.  She states that it has been worsening over that time.  She states that she was on sexually active 1 month ago.  She states that she is protection and does not know of any sexually transmitted disease that she may be exposed to.     Past Medical History:  Diagnosis Date   ADD (attention deficit disorder) without hyperactivity    Anemia    Lumbar herniated disc     Patient Active Problem List   Diagnosis Date Noted   PID (acute pelvic inflammatory disease) 05/20/2019   Low grade squamous intraepithelial lesion on cytologic smear of cervix (LGSIL) 12/15/2016   Postoperative state 05/25/2013   Umbilical hernia, incarcerated 05/12/2013   Pre-op evaluation 05/09/2013   Hernia, umbilical 03/22/2013   Contraceptive management 03/22/2013    Past Surgical History:  Procedure Laterality Date   CESAREAN SECTION     DIAGNOSTIC LAPAROSCOPY     tacked pelvic ligaments due to prolapse and removed iud   IUD REMOVAL     UMBILICAL HERNIA REPAIR N/A 05/12/2013   Procedure: UMBILICAL HERNIA REPAIR WITHOUT MESH;  Surgeon: Marlane Hatcher, MD;  Location: [facility];  Service: General;  Laterality: N/A;   UTERINE SUSPENSION     VULVECTOMY Bilateral 05/12/2013   Procedure: Labial Reduction/Labioplasty;  Surgeon: Tilda Burrow, MD;   Location: [facility];  Service: Gynecology;  Laterality: Bilateral;     OB History    Gravida  2   Para  2   Term  2   Preterm  0   AB  0   Living  2     SAB  0   TAB  0   Ectopic  0   Multiple  0   Live Births  2           Family History  Problem Relation Age of Onset   Stroke Paternal Grandfather        also had ms   Cancer Maternal Grandmother        brain   Diabetes Maternal Uncle    Cancer Mother        oral and cervical   Anesthesia problems Neg Hx    Hypotension Neg Hx    Malignant hyperthermia Neg Hx    Pseudochol deficiency Neg Hx     Social History   Tobacco Use   Smoking status: Current Every Day Smoker    Packs/day: 1.00    Years: 9.00    Pack years: 9.00    Types: Cigarettes   Smokeless tobacco: Never Used  Substance Use Topics   Alcohol use: No   Drug use: Yes    Types: Marijuana    Comment: occasional smoke marijuana    Home Medications Prior to Admission medications   Medication Sig Start Date End  Date Taking? Authorizing Provider  albuterol (VENTOLIN HFA) 108 (90 Base) MCG/ACT inhaler Inhale 2 puffs into the lungs every 6 (six) hours as needed.  01/05/18  Yes [provider]  bismuth subsalicylate (PEPTO-BISMOL) 262 MG/15ML suspension Take 60 mLs by mouth every 6 (six) hours as needed for indigestion or diarrhea or loose stools.    Yes [provider]  Simethicone (GAS-X PO) Take 2 tablets by mouth 2 (two) times daily as needed (stomach pain).   Yes [provider]  amphetamine-dextroamphetamine (ADDERALL) 10 MG tablet Take 20 mg daily with breakfast by mouth.     [provider]  etonogestrel-ethinyl estradiol (NUVARING) 0.12-0.015 MG/24HR vaginal ring INSERT VAGINALLY AND LEAVE IN PLACE FOR 3 CONSECUTIVE WEEKS, THEN REMOVE FOR 1 WEEK. Patient not taking: Reported on 05/20/2019 03/03/18   Tilda BurrowFerguson, John V, MD  ketoconazole (NIZORAL) 2 % cream Apply 1 application topically daily. Apply to  irritated skin when dry.5x/wk Patient not taking: Reported on 05/20/2019 03/03/18   Tilda BurrowFerguson, John V, MD  meloxicam (MOBIC) 15 MG tablet Take 15 mg by mouth as needed.     [provider]  tiZANidine (ZANAFLEX) 4 MG tablet Take by mouth. 01/05/18   [provider]    Allergies    Morphine  Review of Systems   Review of Systems  Constitutional: Negative for chills and fever.  HENT: Negative for congestion and ear pain.   Eyes: Negative for pain.  Respiratory: Negative for cough and shortness of breath.   Cardiovascular: Negative for chest pain.  Gastrointestinal: Positive for abdominal pain. Negative for blood in stool, constipation, diarrhea, nausea and vomiting.  Endocrine: Negative for polydipsia and polyuria.  Genitourinary: Positive for difficulty urinating, frequency, pelvic pain and vaginal discharge. Negative for dysuria, hematuria and vaginal pain.  Musculoskeletal: Negative for myalgias.  Neurological: Negative for dizziness and headaches.    Physical Exam Updated Vital Signs BP 124/78 (BP Location: Right Arm)    Pulse 82    Temp 98.7 F (37.1 C) (Oral)    Resp 16    Ht 5\' 9"  (1.753 m)    Wt 93.9 kg    LMP 05/08/2019    SpO2 97%    BMI 30.57 kg/m   Physical Exam Vitals and nursing note reviewed.  Constitutional:      General: She is not in acute distress. HENT:     Head: Normocephalic and atraumatic.     Nose: Nose normal.  Eyes:     General: No scleral icterus. Cardiovascular:     Rate and Rhythm: Normal rate and regular rhythm.     Pulses: Normal pulses.     Heart sounds: Normal heart sounds.  Pulmonary:     Effort: Pulmonary effort is normal. No respiratory distress.     Breath sounds: No wheezing.  Abdominal:     Palpations: Abdomen is soft.     Tenderness: There is abdominal tenderness.     Comments: Tenderness palpation of lower abdomen.  Suprapubic, left lower quadrant more severe than right lower quadrant.  No guarding.  Some pain of  rebound.  No hernia or masses noted.  No CVA tenderness.  Genitourinary:    Adnexa:        Left: Tenderness present.      Comments: Vulva without lesions or abnormality Vaginal canal without visualized abnormality Cervix appears normal, is closed-there is some purulent discharge from cervix Left adnexal TTP/no CMT or R adnexal TTP Musculoskeletal:     Cervical back: Normal range  of motion.     Right lower leg: No edema.     Left lower leg: No edema.  Skin:    General: Skin is warm and dry.     Capillary Refill: Capillary refill takes less than 2 seconds.  Neurological:     Mental Status: She is alert. Mental status is at baseline.  Psychiatric:        Mood and Affect: Mood normal.        Behavior: Behavior normal.     ED Results / Procedures / Treatments   Labs (all labs ordered are listed, but only abnormal results are displayed) Labs Reviewed  WET PREP, GENITAL - Abnormal; Notable for the following components:      Result Value   Clue Cells Wet Prep HPF POC PRESENT (*)    WBC, Wet Prep HPF POC FEW (*)    All other components within normal limits  COMPREHENSIVE METABOLIC PANEL - Abnormal; Notable for the following components:   Glucose, Bld 102 (*)    AST 10 (*)    All other components within normal limits  CBC - Abnormal; Notable for the following components:   WBC 13.2 (*)    All other components within normal limits  URINALYSIS, ROUTINE W REFLEX MICROSCOPIC - Abnormal; Notable for the following components:   APPearance HAZY (*)    Hgb urine dipstick MODERATE (*)    Nitrite POSITIVE (*)    Bacteria, UA MANY (*)    All other components within normal limits  URINE CULTURE  SARS CORONAVIRUS 2 (TAT 6-24 HRS)  LIPASE, BLOOD  HIV ANTIBODY (ROUTINE TESTING W REFLEX)  RPR  POC URINE PREG, ED  GC/CHLAMYDIA PROBE AMP ([place]) NOT AT [facility]    EKG None  Radiology US Transvaginal Non-OB  Result Date: 05/20/2019 CLINICAL DATA:  Lower abdominal pain with concerning  CT finding, suggested ultrasound follow-up. Pain for 3 days. EXAM: TRANSABDOMINAL AND TRANSVAGINAL ULTRASOUND OF PELVIS DOPPLER ULTRASOUND OF OVARIES TECHNIQUE: Both transabdominal and transvaginal ultrasound examinations of the pelvis were performed. Transabdominal technique was performed for global imaging of the pelvis including uterus, ovaries, adnexal regions, and pelvic cul-de-sac. It was necessary to proceed with endovaginal exam following the transabdominal exam to visualize the uterus and adnexa. Color and duplex Doppler ultrasound was utilized to evaluate blood flow to the ovaries. COMPARISON:  CT study of 05/20/2019 FINDINGS: Uterus Measurements: 9.9 x 5.2 x 5.5 cm = volume: 150 mL. No fibroids or other mass visualized. Endometrium Thickness: 11 mm.  No focal abnormality visualized. Right ovary Measurements: 4.3 x 2.0 x 2.5 cm = volume: 11.2 mL. Normal appearance/no adnexal mass. Left ovary Measurements: 4.0 x 4.9 x 4.6 cm = volume: 47 mL. Complex appearing cystic area within the ovary showing heterogeneity. This measures 3.3x 1.1 x 3.0 cm Septated elongated tubular structure extending to the left adnexa. Pulsed Doppler evaluation of both ovaries demonstrates normal low-resistance arterial and venous waveforms. Increased flow in the left parametrial region and adnexa. Other findings No free fluid visualized though there is fluid in the cul-de-sac on the recent CT. IMPRESSION: 1. Findings tubo-ovarian complex with pyosalpinx. Electronically Signed   By: Donzetta Kohut M.D.   On: 05/20/2019 14:19   US Pelvis Complete  Result Date: 05/20/2019 CLINICAL DATA:  Lower abdominal pain with concerning CT finding, suggested ultrasound follow-up. Pain for 3 days. EXAM: TRANSABDOMINAL AND TRANSVAGINAL ULTRASOUND OF PELVIS DOPPLER ULTRASOUND OF OVARIES TECHNIQUE: Both transabdominal and transvaginal ultrasound examinations of the pelvis were performed. Transabdominal  technique was performed for global imaging of the  pelvis including uterus, ovaries, adnexal regions, and pelvic cul-de-sac. It was necessary to proceed with endovaginal exam following the transabdominal exam to visualize the uterus and adnexa. Color and duplex Doppler ultrasound was utilized to evaluate blood flow to the ovaries. COMPARISON:  CT study of 05/20/2019 FINDINGS: Uterus Measurements: 9.9 x 5.2 x 5.5 cm = volume: 150 mL. No fibroids or other mass visualized. Endometrium Thickness: 11 mm.  No focal abnormality visualized. Right ovary Measurements: 4.3 x 2.0 x 2.5 cm = volume: 11.2 mL. Normal appearance/no adnexal mass. Left ovary Measurements: 4.0 x 4.9 x 4.6 cm = volume: 47 mL. Complex appearing cystic area within the ovary showing heterogeneity. This measures 3.3x 1.1 x 3.0 cm Septated elongated tubular structure extending to the left adnexa. Pulsed Doppler evaluation of both ovaries demonstrates normal low-resistance arterial and venous waveforms. Increased flow in the left parametrial region and adnexa. Other findings No free fluid visualized though there is fluid in the cul-de-sac on the recent CT. IMPRESSION: 1. Findings tubo-ovarian complex with pyosalpinx. Electronically Signed   By: Donzetta Kohut M.D.   On: 05/20/2019 14:19   CT ABDOMEN PELVIS W CONTRAST  Result Date: 05/20/2019 CLINICAL DATA:  Low abdominal pain for 3 weeks with constipation. Fever. Evaluate for diverticulitis, abscess and small-bowel obstruction. EXAM: CT ABDOMEN AND PELVIS WITH CONTRAST TECHNIQUE: Multidetector CT imaging of the abdomen and pelvis was performed using the standard protocol following bolus administration of intravenous contrast. CONTRAST:  OMNIPAQUE IOHEXOL 300 MG/ML  SOLN COMPARISON:  Limited correlation made with lumbar MRI 12/05/2016 and pelvic ultrasound 02/13/2009. FINDINGS: Lower chest: Clear lung bases. No significant pleural or pericardial effusion. Hepatobiliary: The liver is normal in density without suspicious focal abnormality. No evidence  of gallstones, gallbladder wall thickening or biliary dilatation. Pancreas: Unremarkable. No pancreatic ductal dilatation or surrounding inflammatory changes. Spleen: Normal in size without focal abnormality. Adrenals/Urinary Tract: Both adrenal glands appear normal. The kidneys appear normal without evidence mass lesion, urinary tract calculus or hydronephrosis. The bladder appears unremarkable for its degree of distention. Stomach/Bowel: No evidence of bowel wall thickening, distention or surrounding inflammatory change. No enteric contrast was administered. The appendix appears normal. Vascular/Lymphatic: There are no enlarged abdominal or pelvic lymph nodes. No significant vascular findings. 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 There is a thick-walled tubular fluid collection in the left adnexa, measuring 8.1 x 3.2 cm on image 64/2. Inferior to this, there is a complex left adnexal lesion measuring 3.9 x 3.5 cm on image 68/2. The right ovary appears normal. A small amount of focal fluid in the cul-de-sac measures 3.5 x 2.1 cm on image 66/2 and demonstrates peripheral enhancement. There are inflammatory changes in the surrounding pelvic fat, and these findings are suspicious for pelvic inflammatory disease. Other: Small umbilical hernia containing only fat. No generalized ascites, free air or other focal extraluminal fluid collection. Musculoskeletal: No acute or significant osseous findings. Degenerative disc disease at L5-S1. IMPRESSION: 1. Complex left adnexal lesions with surrounding inflammatory changes, suspicious for pelvic inflammatory disease/tubo-ovarian abscess. Ovarian torsion not completely excluded. Recommend further evaluation with pelvic ultrasound. 2. No evidence of diverticulitis, bowel obstruction or perforation. The appendix appears normal. 3. 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  Electronically Signed   By: Carey Bullocks M.D.   On: 05/20/2019 12:43   [place] Art/Ven Flow Abd Pelv Doppler  Result Date: 05/20/2019 CLINICAL  DATA:  Lower abdominal pain with concerning CT finding, suggested ultrasound follow-up. Pain for 3 days. EXAM: TRANSABDOMINAL AND TRANSVAGINAL ULTRASOUND OF PELVIS DOPPLER ULTRASOUND OF OVARIES TECHNIQUE: Both transabdominal and transvaginal ultrasound examinations of the pelvis were performed. Transabdominal technique was performed for global imaging of the pelvis including uterus, ovaries, adnexal regions, and pelvic cul-de-sac. It was necessary to proceed with endovaginal exam following the transabdominal exam to visualize the uterus and adnexa. Color and duplex Doppler ultrasound was utilized to evaluate blood flow to the ovaries. COMPARISON:  CT study of 05/20/2019 FINDINGS: Uterus Measurements: 9.9 x 5.2 x 5.5 cm = volume: 150 mL. No fibroids or other mass visualized. Endometrium Thickness: 11 mm.  No focal abnormality visualized. Right ovary Measurements: 4.3 x 2.0 x 2.5 cm = volume: 11.2 mL. Normal appearance/no adnexal mass. Left ovary Measurements: 4.0 x 4.9 x 4.6 cm = volume: 47 mL. Complex appearing cystic area within the ovary showing heterogeneity. This measures 3.3x 1.1 x 3.0 cm Septated elongated tubular structure extending to the left adnexa. Pulsed Doppler evaluation of both ovaries demonstrates normal low-resistance arterial and venous waveforms. Increased flow in the left parametrial region and adnexa. Other findings No free fluid visualized though there is fluid in the cul-de-sac on the recent CT. IMPRESSION: 1. Findings tubo-ovarian complex with pyosalpinx. Electronically Signed   By: Donzetta Kohut M.D.   On: 05/20/2019 14:19    Procedures .Critical Care Performed by: Gailen Shelter, PA Authorized by: Gailen Shelter, PA   Critical care provider statement:    Critical care time (minutes):  31   Critical care time was exclusive of:   Separately billable procedures and treating other patients and teaching time   Critical care was necessary to treat or prevent imminent or life-threatening deterioration of the following conditions: infection threatening fertility    Critical care was time spent personally by me on the following activities:  Discussions with consultants, evaluation of patient's response to treatment, examination of patient, review of old charts, re-evaluation of patient's condition, pulse oximetry, ordering and review of radiographic studies, ordering and review of laboratory studies and ordering and performing treatments and interventions   I assumed direction of critical care for this patient from another provider in my specialty: no     (including critical care time)  Medications Ordered in ED Medications  cefTRIAXone (ROCEPHIN) injection 500 mg (500 mg Intramuscular Not Given 05/20/19 1609)  prenatal multivitamin tablet 1 tablet (has no administration in time range)  enoxaparin (LOVENOX) injection 40 mg (has no administration in time range)  0.9 %  sodium chloride infusion (has no administration in time range)  cefoTEtan (CEFOTAN) 1 g in sodium chloride 0.9 % 100 mL IVPB (has no administration in time range)  metroNIDAZOLE (FLAGYL) IVPB 500 mg (has no administration in time range)  ibuprofen (ADVIL) tablet 600 mg (has no administration in time range)  oxyCODONE-acetaminophen (PERCOCET/ROXICET) 5-325 MG per tablet 1-2 tablet (has no administration in time range)  butorphanol (STADOL) injection 1-2 mg (has no administration in time range)  polyethylene glycol (MIRALAX / GLYCOLAX) packet 17 g (has no administration in time range)  ondansetron (ZOFRAN) tablet 4 mg (has no administration in time range)    Or  ondansetron (ZOFRAN) injection 4 mg (has no administration in time range)  sodium chloride flush (NS) 0.9 % injection 3 mL (3 mLs Intravenous Given 05/20/19 1231)  dicyclomine (BENTYL) capsule 20 mg (20 mg  Oral Given 05/20/19 1202)  acetaminophen (TYLENOL) tablet 650 mg (650  mg Oral Given 05/20/19 1202)  iohexol (OMNIPAQUE) 300 MG/ML solution 100 mL (100 mLs Intravenous Contrast Given 05/20/19 1222)  doxycycline (VIBRA-TABS) tablet 100 mg (100 mg Oral Given 05/20/19 1449)  metroNIDAZOLE (FLAGYL) tablet 500 mg (500 mg Oral Given 05/20/19 1449)    ED Course  I have reviewed the triage vital signs and the nursing notes.  Pertinent labs & imaging results that were available during my care of the patient were reviewed by me and considered in my medical decision making (see chart for details).    MDM Rules/Calculators/A&P                      Patient 34 year old female with recent new sexual partner no history of PID in the past presented today with lower abdominal pain that is worse on the left lower quadrant.  She has notable left adnexal tenderness, left lower pelvic pain and was found to have concerning abnormalities on CT scan that required further evaluation on pelvic ultrasound to rule out torsion/TOA.  Pelvic ultrasound confirmed tubo-ovarian abscess.  Prep shows few WBC and likely BV present.  CBC notable for mild leukocytosis without acute abnormality.  UA is with squamous epithelium representing likely contamination however does have positive nitrates, no bacteria, moderate blood.  We will culture urine and hold antibiotics with the results of pelvic ultrasound.  I discussed this case with my attending physician who cosigned this note including patient's presenting symptoms, physical exam, and planned diagnostics and interventions. Attending physician stated agreement with plan or made changes to plan which were implemented.   Attending physician assessed patient at bedside.  Discussed case with Dr. Glo Herring who assessed patient at bedside and admitted patient for tubo-ovarian abscess.  Covid test ordered and pending at this time.  She has received antibiotic treatment for PID as well as additional  antibiotics per Dr. Glo Herring for [facility].   Final Clinical Impression(s) / ED Diagnoses Final diagnoses:  Tubo-ovarian abscess    Rx / DC Orders ED Discharge Orders    None       Tedd Sias, [place] 05/20/19 1709    Tedd Sias, [place] 05/20/19 1710    Milton Ferguson, MD 05/21/19 [phone]

## 2019-05-20 NOTE — H&P (Signed)
Date of Service:  05/20/2019  4:08 PM                  Signed          Expand AllCollapse All            Expand widget buttonCollapse widget button    Show:Clear all   ManualTemplateCopied  Added by:     Jonnie Kind, MD   Hover for detailscustomization button                                                                                                                     Reason for Consult:suspected left TOA with pyosalpinx  Referring Physician: Roderic Palau, MD     Allison Miles is an 34 y.o. female, LOMP 2 wk ago, sexually active with new partner x 2 months, who denies any pain til the last few days, with low grade fever 99-100.2. Nausea. Normal bm.   CT and u/s indicate a left TOA with pyosalpinx. Pt admitted for IV abx and may be candidate for drainage, depending on response.Marland Kitchen      Pertinent Gynecological History:  Menses: flow is moderate  Bleeding:   Contraception: condoms  DES exposure: unknown  Blood transfusions: none  Sexually transmitted diseases: no past history  Previous GYN Procedures: labioplasty 2015   Last mammogram:  Date:   Last pap: normal Date: 03/03/2018  OB History: G G2P2002                   Menstrual History:  Menarche age:   Patient's last menstrual period was 05/08/2019.            Past Medical History:    Diagnosis   Date    .   ADD (attention deficit disorder) without hyperactivity        .   Anemia        .   Lumbar herniated disc                    Past Surgical History:    Procedure   Laterality   Date    .   CESAREAN SECTION            .   DIAGNOSTIC LAPAROSCOPY                tacked pelvic ligaments due to prolapse and removed iud    .   IUD REMOVAL            .   UMBILICAL HERNIA REPAIR    N/A   05/12/2013        Procedure: UMBILICAL HERNIA REPAIR WITHOUT MESH;  Surgeon: Scherry Ran, MD;  Location: [facility];  Service: General;  Laterality: N/A;    .   UTERINE SUSPENSION            .   VULVECTOMY   Bilateral   05/12/2013        Procedure: Labial  Reduction/Labioplasty;  Surgeon: Tilda Burrow, MD;  Location: [facility];  Service: Gynecology;  Laterality: Bilateral;                Family History    Problem   Relation   Age of Onset    .   Stroke   Paternal Grandfather                also had ms    .   Cancer   Maternal Grandmother                brain    .   Diabetes   Maternal Uncle        .   Cancer   Mother                oral and cervical    .   Anesthesia problems   Neg Hx        .   Hypotension   Neg Hx        .   Malignant hyperthermia   Neg Hx        .   Pseudochol deficiency   Neg Hx              Social History:  reports that she has been smoking cigarettes. She has a 9.00 pack-year smoking history. She has never used smokeless tobacco. She reports current drug use. Drug: Marijuana. She reports that she does not drink alcohol.     Allergies:         Allergies    Allergen   Reactions    .   Morphine   Other (See Comments)            anxious          Medications: I have reviewed the patient's current medications.     Review of Systems     Blood pressure 124/78, pulse 82, temperature 98.7 F (37.1 C), temperature source Oral, resp. rate 16, height 5\' 9"  (1.753 m), weight 93.9 kg, last menstrual period 05/08/2019, SpO2 97 %.  Physical Exam   Constitutional: She is oriented to person, place, and time. She appears well-developed and well-nourished. No distress.   HENT:   Head: Normocephalic and atraumatic.   Eyes: Pupils are equal, round, and reactive to light.   Cardiovascular: Normal rate.    Respiratory: Effort normal.   GI: Soft. She exhibits no mass. There is abdominal tenderness. There is guarding. There is no rebound.  LLQ Pain and mild guarding.  Genitourinary:    Genitourinary Comments: Done by ED provider, not repeated.    Musculoskeletal:         General: Normal range of motion.     Cervical back: Neck supple.  Neurological: She is alert and oriented to person, place, and time.   Skin: Skin is warm and dry.  Psychiatric: She has a normal mood and affect. Her behavior is normal. Judgment and thought content normal.         Lab Results Last 48 Hours  Imaging Results (Last 48 hours)                                                  Assessment/Plan:  PID with left TOA and pyosalpinx.  Admit for IV abx therapy, zosyn flagyl     Tilda Burrow  05/20/2019

## 2019-05-20 NOTE — ED Notes (Signed)
Pt tolerated sips of water with medications; pt denies nausea or vomiting.

## 2019-05-21 LAB — CBC WITH DIFFERENTIAL/PLATELET
Abs Immature Granulocytes: 0.04 10*3/uL (ref 0.00–0.07)
Basophils Absolute: 0.1 10*3/uL (ref 0.0–0.1)
Basophils Relative: 1 %
Eosinophils Absolute: 0.3 10*3/uL (ref 0.0–0.5)
Eosinophils Relative: 2 %
HCT: 40.4 % (ref 36.0–46.0)
Hemoglobin: 13.2 g/dL (ref 12.0–15.0)
Immature Granulocytes: 0 %
Lymphocytes Relative: 18 %
Lymphs Abs: 2.3 10*3/uL (ref 0.7–4.0)
MCH: 30.8 pg (ref 26.0–34.0)
MCHC: 32.7 g/dL (ref 30.0–36.0)
MCV: 94.4 fL (ref 80.0–100.0)
Monocytes Absolute: 1.4 10*3/uL — ABNORMAL HIGH (ref 0.1–1.0)
Monocytes Relative: 11 %
Neutro Abs: 8.5 10*3/uL — ABNORMAL HIGH (ref 1.7–7.7)
Neutrophils Relative %: 68 %
Platelets: 285 10*3/uL (ref 150–400)
RBC: 4.28 MIL/uL (ref 3.87–5.11)
RDW: 13.3 % (ref 11.5–15.5)
WBC: 12.6 10*3/uL — ABNORMAL HIGH (ref 4.0–10.5)
nRBC: 0 % (ref 0.0–0.2)

## 2019-05-21 LAB — SARS CORONAVIRUS 2 (TAT 6-24 HRS): SARS Coronavirus 2: NEGATIVE

## 2019-05-21 LAB — RPR: RPR Ser Ql: NONREACTIVE

## 2019-05-21 MED ORDER — DOXYCYCLINE HYCLATE 100 MG PO TABS
100.0000 mg | ORAL_TABLET | Freq: Two times a day (BID) | ORAL | Status: DC
  Administered 2019-05-21 – 2019-05-22 (×3): 100 mg via ORAL
  Filled 2019-05-21 (×3): qty 1

## 2019-05-21 NOTE — Hospital Course (Addendum)
Physician Discharge Summary  Patient ID: Allison Miles MRN: [id number] DOB/AGE: Apr 22, 1985 34 y.o.  Admit date: 05/20/2019 Discharge date:05/22/19  Admission Diagnoses:Acute PID with Left Tuboovarian abscess   Discharge Diagnoses:  Active Problems:   PID (acute pelvic inflammatory disease)   Discharged Condition: good  Hospital Course: Allison Miles is an 33 y.o. female, LOMP 2 wk ago, sexually active with new partner x 2 months, who denies any pain til the last few days, with low grade fever 99-100.2. Nausea. Normal bm.  CT and u/s indicate a left TOA with pyosalpinx. Pt admitted for IV abx and may be candidate for drainage, depending on response..  Clinical response is better than expected.  She remained afebrile from admission, had sed rate of 25 on hospital day 2 , And rated her pain as a 1 compared to a 7 on admission.  She had normal bowel function and required only ibuprofen for pain.  She is discharged home for oral antibiotic therapy for 10 days and follow-up in 2 weeks Consults: gynecology  Significant Diagnostic Studies: labs:  CBC Latest Ref Rng & Units 05/21/2019 05/20/2019 12/05/2016  WBC 4.0 - 10.5 K/uL 12.6(H) 13.2(H) 9.7  Hemoglobin 12.0 - 15.0 g/dL 85.0 27.7 41.2  Hematocrit 36.0 - 46.0 % 40.4 43.5 40.1  Platelets 150 - 400 K/uL 285 303 239     Treatments: IV hydration and antibiotics: metronidazole and cefoxitin, switch to metronidazole and doxy cyclin upon discharge  Discharge Exam: Blood pressure 96/62, pulse 64, temperature 98.1 F (36.7 C), resp. rate 16, height 5\' 9"  (1.753 m), weight 93.9 kg, last menstrual period 05/08/2019, SpO2 99 %. General appearance: alert, cooperative, and no distress GI: soft, non-tender; bowel sounds normal; no masses,  no organomegaly Extremities: extremities normal, atraumatic, no cyanosis or edema and Homans sign is negative, no sign of DVT  Disposition:   Home on oral antibiotics Doxycycline 100 mg twice daily x10  days Metronidazole 500 mg twice daily x10 days     Signed: 05/10/2019 05/21/2019, 8:20 AM

## 2019-05-21 NOTE — Progress Notes (Addendum)
Subjective: HD 1 Patient reports pain is slightly better, hasn't been up yet. tolerating PO, + flatus and no problems voiding.  Has fentanyl for severe pain, not using  Objective: I have reviewed patient's vital signs, medications and labs.  General: alert, cooperative and no distress GI: normal findings: soft, mild LLQ GUARDING, NO REBOUND Extremities: Homans sign is negative, no sign of DVT  CBC Latest Ref Rng & Units 05/21/2019 05/20/2019 12/05/2016  WBC 4.0 - 10.5 K/uL 12.6(H) 13.2(H) 9.7  Hemoglobin 12.0 - 15.0 g/dL 63.8 75.6 43.3  Hematocrit 36.0 - 46.0 % 40.4 43.5 40.1  Platelets 150 - 400 K/uL 285 303 239    Assessment/Plan: PID with left TOA  Continue IV abx. Recheck CBC Monday. Hopeful transition to outptat that time added Doxycycline PO in anticipation of outpt care.  LOS: 1 day    Tilda Burrow 05/21/2019, 8:21 AM

## 2019-05-22 DIAGNOSIS — N739 Female pelvic inflammatory disease, unspecified: Secondary | ICD-10-CM

## 2019-05-22 LAB — SEDIMENTATION RATE: Sed Rate: 25 mm/hr — ABNORMAL HIGH (ref 0–22)

## 2019-05-22 MED ORDER — METRONIDAZOLE 500 MG PO TABS: 500.0000 mg | ORAL_TABLET | Freq: Two times a day (BID) | ORAL | 0 refills | Status: DC

## 2019-05-22 MED ORDER — DOXYCYCLINE HYCLATE 100 MG PO TABS: 100.0000 mg | ORAL_TABLET | Freq: Two times a day (BID) | ORAL | 0 refills | Status: DC

## 2019-05-22 NOTE — Discharge Instructions (Signed)
Please take both the metronidazole and the doxycycline twice daily for 10 days Please notify me for any fever above 99.6 or return of the left-sided pain

## 2019-05-22 NOTE — Progress Notes (Signed)
Subjective: Patient reports tolerating PO, + flatus and no problems voiding.  Rates pain a 1 , was 7 on admit.  Objective: I have reviewed patient's vital signs and labs.afebrile since admit     Assessment/Plan: Clinical improvement.  Will check wbc, and esr, If wbc reducing witll d/c home for outpt antibiotics.  LOS: 2 days    Jonnie Kind 05/22/2019, 9:40 AM

## 2019-05-22 NOTE — Progress Notes (Signed)
Nsg Discharge Note  Admit Date:  05/20/2019 Discharge date: 05/22/2019   Melony Overly to be D/C'd Home per MD order.  AVS completed.  Copy for chart, and copy for patient signed, and dated. Patient/caregiver able to verbalize understanding.  Discharge Medication: Allergies as of 05/22/2019      Reactions   Morphine Other (See Comments)   anxious      Medication List    TAKE these medications   albuterol 108 (90 Base) MCG/ACT inhaler Commonly known as: VENTOLIN HFA Inhale 2 puffs into the lungs every 6 (six) hours as needed.   amphetamine-dextroamphetamine 10 MG tablet Commonly known as: ADDERALL Take 20 mg daily with breakfast by mouth.   doxycycline 100 MG tablet Commonly known as: VIBRA-TABS Take 1 tablet (100 mg total) by mouth 2 (two) times daily.   etonogestrel-ethinyl estradiol 0.12-0.015 MG/24HR vaginal ring Commonly known as: NuvaRing INSERT VAGINALLY AND LEAVE IN PLACE FOR 3 CONSECUTIVE WEEKS, THEN REMOVE FOR 1 WEEK.   GAS-X PO Take 2 tablets by mouth 2 (two) times daily as needed (stomach pain).   ketoconazole 2 % cream Commonly known as: NIZORAL Apply 1 application topically daily. Apply to irritated skin when dry.5x/wk   meloxicam 15 MG tablet Commonly known as: MOBIC Take 15 mg by mouth as needed.   metroNIDAZOLE 500 MG tablet Commonly known as: FLAGYL Take 1 tablet (500 mg total) by mouth 2 (two) times daily.   Pepto-Bismol 262 MG/15ML suspension Generic drug: bismuth subsalicylate Take 60 mLs by mouth every 6 (six) hours as needed for indigestion or diarrhea or loose stools.   tiZANidine 4 MG tablet Commonly known as: ZANAFLEX Take by mouth.       Discharge Assessment: Vitals:   05/21/19 2130 05/22/19 0531  BP: 110/81 93/63  Pulse: 68 62  Resp: 18 18  Temp: 98.9 F (37.2 C) 98.2 F (36.8 C)  SpO2: 98% 98%   Skin clean, dry and intact without evidence of skin break down, no evidence of skin tears noted. IV catheter discontinued  intact. Site without signs and symptoms of complications - no redness or edema noted at insertion site, patient denies c/o pain - only slight tenderness at site.  Dressing with slight pressure applied.  D/c Instructions-Education: Discharge instructions given to patient/family with verbalized understanding. D/c education completed with patient/family including follow up instructions, medication list, d/c activities limitations if indicated, with other d/c instructions as indicated by MD - patient able to verbalize understanding, all questions fully answered. Patient instructed to return to ED, call 911, or call MD for any changes in condition.  Patient escorted via WC, and D/C home via private auto.  Lonn Georgia, RN 05/22/2019 4:23 PM

## 2019-05-23 LAB — URINE CULTURE: Culture: >=100000 — AB

## 2019-05-23 LAB — GC/CHLAMYDIA PROBE AMP (~~LOC~~) NOT AT ARMC
Chlamydia: NEGATIVE
Comment: NEGATIVE
Comment: NORMAL
Neisseria Gonorrhea: NEGATIVE

## 2019-05-23 NOTE — Discharge Summary (Signed)
Physician Discharge Summary  Patient ID: AHRIA SLAPPEY MRN: [id number] DOB/AGE: 08-26-1985 34 y.o.  Admit date: 05/20/2019 Discharge date: 05/22/2019  Admission Diagnoses:  Discharge Diagnoses:  Principal Problem:   PID (acute pelvic inflammatory disease) E Coli Uti  Discharged Condition: good  Hospital Course: improvedHeather L Miles is an 35 y.o. female, LOMP 2 wk ago, sexually active with new partner x 2 months, who denies any pain til the last few days, with low grade fever 99-100.2. Nausea. Normal bm.  CT and u/s indicate a left TOA with pyosalpinx. Pt admitted for IV abx and may be candidate for drainage, depending on response..  response was optimal, and pain dimished x 2 d   Consults: gynecology   Subjective: Patient reports tolerating PO, + flatus and no problems voiding.  Rates pain a 1 , was 7 on admit.  Objective: I have reviewed patient's vital signs and labs.afebrile since admit  Significant Diagnostic Studies: labs:  CBC Latest Ref Rng & Units 05/21/2019 05/20/2019 12/05/2016  WBC 4.0 - 10.5 K/uL 12.6(H) 13.2(H) 9.7  Hemoglobin 12.0 - 15.0 g/dL 13.2 14.1 13.2  Hematocrit 36.0 - 46.0 % 40.4 43.5 40.1  Platelets 150 - 400 K/uL 285 303 239     Treatments: IV hydration and antibiotics: gentamycin and ceftriaxone  Discharge Exam: Blood pressure 93/63, pulse 62, temperature 98.2 F (36.8 C), temperature source Oral, resp. rate 18, height 5\' 9"  (1.753 m), weight 93.9 kg, last menstrual period 05/08/2019, SpO2 98 %. Physical Examination: General appearance - alert, well appearing, and in no distress, oriented to person, place, and time and normal appearing weight Mental status - alert, oriented to person, place, and time    Disposition: Discharge disposition: 01-Home or Self Care       Discharge Instructions    Call MD for:  severe uncontrolled pain   Complete by: As directed    Call MD for:  temperature >100.4   Complete by: As directed    Diet -  low sodium heart healthy   Complete by: As directed    Increase activity slowly   Complete by: As directed    Increase activity slowly   Complete by: As directed      Allergies as of 05/22/2019      Reactions   Morphine Other (See Comments)   anxious      Medication List    TAKE these medications   albuterol 108 (90 Base) MCG/ACT inhaler Commonly known as: VENTOLIN HFA Inhale 2 puffs into the lungs every 6 (six) hours as needed.   amphetamine-dextroamphetamine 10 MG tablet Commonly known as: ADDERALL Take 20 mg daily with breakfast by mouth.   doxycycline 100 MG tablet Commonly known as: VIBRA-TABS Take 1 tablet (100 mg total) by mouth 2 (two) times daily.   etonogestrel-ethinyl estradiol 0.12-0.015 MG/24HR vaginal ring Commonly known as: NuvaRing INSERT VAGINALLY AND LEAVE IN PLACE FOR 3 CONSECUTIVE WEEKS, THEN REMOVE FOR 1 WEEK.   GAS-X PO Take 2 tablets by mouth 2 (two) times daily as needed (stomach pain).   ketoconazole 2 % cream Commonly known as: NIZORAL Apply 1 application topically daily. Apply to irritated skin when dry.5x/wk   meloxicam 15 MG tablet Commonly known as: MOBIC Take 15 mg by mouth as needed.   metroNIDAZOLE 500 MG tablet Commonly known as: FLAGYL Take 1 tablet (500 mg total) by mouth 2 (two) times daily.   Pepto-Bismol 262 MG/15ML suspension Generic drug: bismuth subsalicylate Take 60 mLs by mouth every 6 (six)  hours as needed for indigestion or diarrhea or loose stools.   tiZANidine 4 MG tablet Commonly known as: ZANAFLEX Take by mouth.      Follow-up Information    Tilda Burrow, MD Follow up in 2 week(s).   Specialties: Obstetrics and Gynecology, Radiology Contact information: [address] [place] [address] [phone]           Signed: Tilda Burrow 05/23/2019, 10:18 PM

## 2019-05-23 NOTE — Progress Notes (Signed)
Has uti with >10^5 E coli, sensitive to all meds.  Pt currently has received rocephin, and now Cefotetan x 2 days, should be covered.

## 2019-06-02 ENCOUNTER — Ambulatory Visit (INDEPENDENT_AMBULATORY_CARE_PROVIDER_SITE_OTHER): Payer: Medicaid Other | Admitting: Obstetrics and Gynecology

## 2019-06-02 ENCOUNTER — Encounter: Payer: Self-pay | Admitting: Obstetrics and Gynecology

## 2019-06-02 ENCOUNTER — Other Ambulatory Visit: Payer: Self-pay

## 2019-06-02 VITALS — BP 110/73 | HR 78 | Ht 69.0 in | Wt 208.6 lb

## 2019-06-02 DIAGNOSIS — Z3009 Encounter for other general counseling and advice on contraception: Secondary | ICD-10-CM

## 2019-06-02 DIAGNOSIS — N730 Acute parametritis and pelvic cellulitis: Secondary | ICD-10-CM | POA: Diagnosis not present

## 2019-06-02 DIAGNOSIS — N7093 Salpingitis and oophoritis, unspecified: Secondary | ICD-10-CM

## 2019-06-02 MED ORDER — ETONOGESTREL-ETHINYL ESTRADIOL 0.12-0.015 MG/24HR VA RING: VAGINAL_RING | VAGINAL | 4 refills | Status: DC

## 2019-06-02 NOTE — Progress Notes (Signed)
Patient ID: Allison Miles, female   DOB: 23-Jun-1985, 34 y.o.   MRN: [id number]    [facility] Visit  06/02/19           Patient name: Allison Miles MRN [id number]  Date of birth: 04-26-1985  CC & HPI:  Allison Miles is a 34 y.o. female presenting today for two week ED follow up for acute pelvic inflammatory disease.    She was admitted to [facility] on 05/20/2019. She had been experiencing lower abdominal pain for three days prior to visit to ED with increased urination and constipation. CT and u/s indicated a left TOA with pyosalpinx. She was treated with IV hydration and antibiotics (gentamycin and ceftriaxone). She was diagnosed with uti with >10^5 E coli. Discharged on 05/22/2019.   She notes that she developed a yeast infection, but she ate a bunch of yogurt and she things that took care of it.   She is still having some pain.   She wants to get back on birth control. She had nuva ring previously and she would like to go back on that, continuously so she does not have a period.   ROS:  ROS  Component     Latest Ref Rng & Units 05/20/2019  Specimen Description      URINE, CLEAN CATCH . . .  Special Requests      NONE . Marland Kitchen .  Culture      >=100,000 COLONIES/mL ESCHERICHIA COLI (A)  Report Status      05/23/2019 FINAL  Organism ID, Bacteria      ESCHERICHIA COLI (A)    Pertinent History Reviewed:   Reviewed: Significant for  Medical         Past Medical History:  Diagnosis Date  . ADD (attention deficit disorder) without hyperactivity   . Anemia   . Lumbar herniated disc                               Surgical Hx:    Past Surgical History:  Procedure Laterality Date  . CESAREAN SECTION    . DIAGNOSTIC LAPAROSCOPY     tacked pelvic ligaments due to prolapse and removed iud  . IUD REMOVAL    . UMBILICAL HERNIA REPAIR N/A 05/12/2013   Procedure: UMBILICAL HERNIA REPAIR WITHOUT MESH;  Surgeon: Scherry Ran, MD;  Location: [facility];  Service:  General;  Laterality: N/A;  . UTERINE SUSPENSION    . VULVECTOMY Bilateral 05/12/2013   Procedure: Labial Reduction/Labioplasty;  Surgeon: Jonnie Kind, MD;  Location: [facility];  Service: Gynecology;  Laterality: Bilateral;   Medications: Reviewed & Updated - see associated section                       Current Outpatient Medications:  .  doxycycline (VIBRA-TABS) 100 MG tablet, Take 1 tablet (100 mg total) by mouth 2 (two) times daily., Disp: 20 tablet, Rfl: 0 .  metroNIDAZOLE (FLAGYL) 500 MG tablet, Take 1 tablet (500 mg total) by mouth 2 (two) times daily., Disp: 20 tablet, Rfl: 0 .  albuterol (VENTOLIN HFA) 108 (90 Base) MCG/ACT inhaler, Inhale 2 puffs into the lungs every 6 (six) hours as needed. , Disp: , Rfl:  .  amphetamine-dextroamphetamine (ADDERALL) 10 MG tablet, Take 20 mg daily with breakfast by mouth. , Disp: , Rfl:  .  bismuth subsalicylate (PEPTO-BISMOL) 262 MG/15ML suspension, Take 60  mLs by mouth every 6 (six) hours as needed for indigestion or diarrhea or loose stools. , Disp: , Rfl:  .  etonogestrel-ethinyl estradiol (NUVARING) 0.12-0.015 MG/24HR vaginal ring, INSERT VAGINALLY AND LEAVE IN PLACE FOR 3 CONSECUTIVE WEEKS, THEN REMOVE FOR 1 WEEK. (Patient not taking: Reported on 05/20/2019), Disp: 1 each, Rfl: 11 .  ketoconazole (NIZORAL) 2 % cream, Apply 1 application topically daily. Apply to irritated skin when dry.5x/wk (Patient not taking: Reported on 05/20/2019), Disp: 60 g, Rfl: 1 .  meloxicam (MOBIC) 15 MG tablet, Take 15 mg by mouth as needed. , Disp: , Rfl:  .  Simethicone (GAS-X PO), Take 2 tablets by mouth 2 (two) times daily as needed (stomach pain)., Disp: , Rfl:  .  tiZANidine (ZANAFLEX) 4 MG tablet, Take by mouth., Disp: , Rfl:    Social History: Reviewed -  reports that she has been smoking cigarettes. She has a 9.00 pack-year smoking history. She has never used smokeless tobacco.  Objective Findings:  Vitals: Blood pressure 110/73, pulse 78, height 5\' 9"  (1.753 m),  weight 208 lb 9.6 oz (94.6 kg), last menstrual period 05/08/2019.  PHYSICAL EXAMINATION General appearance - alert, well appearing, and in no distress, oriented to person, place, and time, normal appearing weight and well hydrated Mental status - alert, oriented to person, place, and time, normal mood, behavior, speech, dress, motor activity, and thought processes, affect appropriate to mood Chest - not examined Heart - not examined Abdomen - not examined Breasts - not examined Skin - normal coloration and turgor, no rashes, no suspicious skin lesions noted   Assessment & Plan:   A:  1.  Birth control options discussed: Reviewed all forms of birth control options available including abstinence; over the counter/barrier methods; depo, nuva ring, Nexplanon, IUD, OCP and patch. Questions were answered.  Pt chooses nuva ring.   2. PID resolving, suspected hydro/pyo salpinx  P:  1.  Return in one week for TV U/S 2. Consider surgical options if dilated tube persists.    By signing my name below, I, 05/10/2019, attest that this documentation has been prepared under the direction and in the presence of [employer], MD. Electronically Signed: Tilda Burrow Medical Scribe. 06/02/19. 12:14 PM.  I personally performed the services described in this documentation, which was SCRIBED in my presence. The recorded information has been reviewed and considered accurate. It has been edited as necessary during review. 06/04/19, MD

## 2019-06-08 ENCOUNTER — Other Ambulatory Visit: Payer: Self-pay | Admitting: Obstetrics and Gynecology

## 2019-06-08 DIAGNOSIS — N7093 Salpingitis and oophoritis, unspecified: Secondary | ICD-10-CM

## 2019-06-08 DIAGNOSIS — N730 Acute parametritis and pelvic cellulitis: Secondary | ICD-10-CM

## 2019-06-09 ENCOUNTER — Telehealth: Payer: Self-pay | Admitting: *Deleted

## 2019-06-09 ENCOUNTER — Other Ambulatory Visit: Payer: Self-pay

## 2019-06-09 ENCOUNTER — Ambulatory Visit (INDEPENDENT_AMBULATORY_CARE_PROVIDER_SITE_OTHER): Payer: Medicaid Other

## 2019-06-09 DIAGNOSIS — N7093 Salpingitis and oophoritis, unspecified: Secondary | ICD-10-CM

## 2019-06-09 DIAGNOSIS — N730 Acute parametritis and pelvic cellulitis: Secondary | ICD-10-CM

## 2019-06-09 MED ORDER — FLUCONAZOLE 150 MG PO TABS: ORAL_TABLET | ORAL | 1 refills | Status: DC

## 2019-06-09 NOTE — Progress Notes (Signed)
PELVIC US TA/TV:homogeneous anteverted subseptate uterus,wnl,EEC 6 mm,normal right ovary,hemorrhagic left ovarian cyst 2.2 x 1.5 x 1.9 cm (smaller),resolved left hydrosalpinx,small amount of complex cul de sac fluid,unable to slide right ovary,right adnexal discomfort during ultrasound  Chaperone Peggy

## 2019-06-09 NOTE — Telephone Encounter (Signed)
Will rx diflucan  

## 2019-06-09 NOTE — Telephone Encounter (Signed)
Pt has yeast infection recent use of abx. Is requesting rx for diflucan.

## 2019-06-16 NOTE — Progress Notes (Signed)
Patient ID: RIKA DAUGHDRILL, female   DOB: 10/26/1985, 34 y.o.   MRN: [id number]    [facility] Visit  @DATE @            Patient name: Allison Miles MRN [id number]  Date of birth: 1985-12-01  CC & HPI:  Allison Miles is a 34 y.o. female presenting today for follow-up to discuss her recent ultrasound results. The patient underwent a transvaginal/pelvic ultrasound on 06/09/2019 to follow-up on left hydrosalpinx and left ovarian cyst. Results did not show any evidence of residual pyosalpinx, reduced mobility in the left ovary, and suspected pelvic adhesions. CDS fluid was normal.  Today, the patient reports see Dr. Johnnye Sima note above  ROS:  ROS   Pertinent History Reviewed:   Reviewed Medical         Past Medical History:  Diagnosis Date  . ADD (attention deficit disorder) without hyperactivity   . Anemia   . Lumbar herniated disc                               Surgical Hx:    Past Surgical History:  Procedure Laterality Date  . CESAREAN SECTION    . DIAGNOSTIC LAPAROSCOPY     tacked pelvic ligaments due to prolapse and removed iud  . IUD REMOVAL    . UMBILICAL HERNIA REPAIR N/A 05/12/2013   Procedure: UMBILICAL HERNIA REPAIR WITHOUT MESH;  Surgeon: Scherry Ran, MD;  Location: [facility];  Service: General;  Laterality: N/A;  . UTERINE SUSPENSION    . VULVECTOMY Bilateral 05/12/2013   Procedure: Labial Reduction/Labioplasty;  Surgeon: Jonnie Kind, MD;  Location: [facility];  Service: Gynecology;  Laterality: Bilateral;   Medications: Reviewed & Updated - see associated section                       Current Outpatient Medications:  .  albuterol (VENTOLIN HFA) 108 (90 Base) MCG/ACT inhaler, Inhale 2 puffs into the lungs every 6 (six) hours as needed. , Disp: , Rfl:  .  amphetamine-dextroamphetamine (ADDERALL) 10 MG tablet, Take 20 mg daily with breakfast by mouth. , Disp: , Rfl:  .  bismuth subsalicylate (PEPTO-BISMOL) 262 MG/15ML suspension, Take 60 mLs by  mouth every 6 (six) hours as needed for indigestion or diarrhea or loose stools. , Disp: , Rfl:  .  doxycycline (VIBRA-TABS) 100 MG tablet, Take 1 tablet (100 mg total) by mouth 2 (two) times daily., Disp: 20 tablet, Rfl: 0 .  etonogestrel-ethinyl estradiol (NUVARING) 0.12-0.015 MG/24HR vaginal ring, Insert vaginally and leave in place for 3 1/2 consecutive weeks, then replace. Repeat. Continuous contraceptive use., Disp: 3 each, Rfl: 4 .  fluconazole (DIFLUCAN) 150 MG tablet, Take 1 now and repeat 1 in 3 days, Disp: 2 tablet, Rfl: 1 .  ketoconazole (NIZORAL) 2 % cream, Apply 1 application topically daily. Apply to irritated skin when dry.5x/wk (Patient not taking: Reported on 05/20/2019), Disp: 60 g, Rfl: 1 .  meloxicam (MOBIC) 15 MG tablet, Take 15 mg by mouth as needed. , Disp: , Rfl:  .  metroNIDAZOLE (FLAGYL) 500 MG tablet, Take 1 tablet (500 mg total) by mouth 2 (two) times daily., Disp: 20 tablet, Rfl: 0 .  Simethicone (GAS-X PO), Take 2 tablets by mouth 2 (two) times daily as needed (stomach pain)., Disp: , Rfl:  .  tiZANidine (ZANAFLEX) 4 MG tablet, Take by mouth., Disp: , Rfl:  Social History: Reviewed -  reports that she has been smoking cigarettes. She has a 9.00 pack-year smoking history. She has never used smokeless tobacco.  Objective Findings:  Vitals: There were no vitals taken for this visit.  PHYSICAL EXAMINATION  By signing my name below, I, Nikki Dom, attest that this documentation has been prepared under the direction and in the presence of Tilda Burrow, MD. Electronically Signed: [employer]. 06/16/19. 9:59 PM.  I personally performed the services described in this documentation, which was SCRIBED in my presence. The recorded information has been reviewed and considered accurate. It has been edited as necessary during review. Tilda Burrow, MD  Please see my note above as a continuation of this precharting documentation

## 2019-06-16 NOTE — Progress Notes (Signed)
PATIENT ID: [name], female     DOB: Feb 21, 1985, 34 y.o.     MRN: [id number]     [facility] Visit  06/16/19           Patient name: Allison Miles MRN [id number]  Date of birth: Jul 21, 1985  CC & HPI:  Allison Miles is a 34 y.o. female presenting today for follow up to her acute pelvic inflammatory disease and review of pelvic ultrasound.  Had an unremarkable ESR of 25, minimally elevated on 05/22/2019 ROS:  Review of Systems  Constitutional: Negative for diaphoresis, fever, malaise/fatigue and weight loss.  HENT: Negative for congestion and sore throat.   Eyes: Negative for blurred vision and double vision.  Respiratory: Negative for cough and shortness of breath.   Cardiovascular: Negative for chest pain, palpitations and leg swelling.  Gastrointestinal: Negative for constipation, diarrhea, nausea and vomiting.  Genitourinary: Negative for frequency and urgency.  Musculoskeletal: Negative for back pain, falls and myalgias.  Skin: Negative for rash.  Neurological: Negative for dizziness, weakness and headaches.  Psychiatric/Behavioral: Negative for depression. The patient is not nervous/anxious.     06/09/2019 PELVIC US TA/TV:homogeneous anteverted subseptate uterus,wnl,EEC 6 mm,normal right ovary,hemorrhagic left ovarian cyst 2.2 x 1.5 x 1.9 cm (smaller),resolved left hydrosalpinx,small amount of complex cul de sac fluid,unable to slide right ovary,right adnexal discomfort during ultrasound My review of the ultrasound showed the left adnexa was not particularly mobile but the hydrosalpinx on the left had resolved Pertinent History Reviewed:  Reviewed: Significant for patient has a little bit of bloating in her lower abdomen but normal bowel function.  No discomfort with intercourse mentioned.  She is using the OC NuvaRing continuous fashion is not having any breakthrough bleeding. Medical         Past Medical History:  Diagnosis Date  . ADD (attention  deficit disorder) without hyperactivity   . Anemia   . Lumbar herniated disc                               Surgical Hx:    Past Surgical History:  Procedure Laterality Date  . CESAREAN SECTION    . DIAGNOSTIC LAPAROSCOPY     tacked pelvic ligaments due to prolapse and removed iud  . IUD REMOVAL    . UMBILICAL HERNIA REPAIR N/A 05/12/2013   Procedure: UMBILICAL HERNIA REPAIR WITHOUT MESH;  Surgeon: Scherry Ran, MD;  Location: [facility];  Service: General;  Laterality: N/A;  . UTERINE SUSPENSION    . VULVECTOMY Bilateral 05/12/2013   Procedure: Labial Reduction/Labioplasty;  Surgeon: Jonnie Kind, MD;  Location: [facility];  Service: Gynecology;  Laterality: Bilateral;   Medications: Reviewed & Updated - see associated section                       Current Outpatient Medications:  .  albuterol (VENTOLIN HFA) 108 (90 Base) MCG/ACT inhaler, Inhale 2 puffs into the lungs every 6 (six) hours as needed. , Disp: , Rfl:  .  amphetamine-dextroamphetamine (ADDERALL) 10 MG tablet, Take 20 mg daily with breakfast by mouth. , Disp: , Rfl:  .  bismuth subsalicylate (PEPTO-BISMOL) 262 MG/15ML suspension, Take 60 mLs by mouth every 6 (six) hours as needed for indigestion or diarrhea or loose stools. , Disp: , Rfl:  .  doxycycline (VIBRA-TABS) 100 MG tablet, Take 1 tablet (100 mg total) by mouth 2 (two) times  daily., Disp: 20 tablet, Rfl: 0 .  etonogestrel-ethinyl estradiol (NUVARING) 0.12-0.015 MG/24HR vaginal ring, Insert vaginally and leave in place for 3 1/2 consecutive weeks, then replace. Repeat. Continuous contraceptive use., Disp: 3 each, Rfl: 4 .  fluconazole (DIFLUCAN) 150 MG tablet, Take 1 now and repeat 1 in 3 days, Disp: 2 tablet, Rfl: 1 .  ketoconazole (NIZORAL) 2 % cream, Apply 1 application topically daily. Apply to irritated skin when dry.5x/wk (Patient not taking: Reported on 05/20/2019), Disp: 60 g, Rfl: 1 .  meloxicam (MOBIC) 15 MG tablet, Take 15 mg by mouth as needed. , Disp: , Rfl:  .   metroNIDAZOLE (FLAGYL) 500 MG tablet, Take 1 tablet (500 mg total) by mouth 2 (two) times daily., Disp: 20 tablet, Rfl: 0 .  Simethicone (GAS-X PO), Take 2 tablets by mouth 2 (two) times daily as needed (stomach pain)., Disp: , Rfl:  .  tiZANidine (ZANAFLEX) 4 MG tablet, Take by mouth., Disp: , Rfl:    Social History: Reviewed -  reports that she has been smoking cigarettes. She has a 9.00 pack-year smoking history. She has never used smokeless tobacco.  Objective Findings:  Vitals: There were no vitals taken for this visit.  PHYSICAL EXAMINATION General appearance - alert, well appearing, and in no distress and oriented to person, place, and time Mental status - alert, oriented to person, place, and time, normal mood, behavior, speech, dress, motor activity, and thought processes Chest - clear to auscultation, no wheezes, rales or rhonchi, symmetric air entry Heart - normal rate, regular rhythm, normal S1, S2, no murmurs, rubs, clicks or gallops Abdomen - soft, nontender, nondistended, no masses or organomegaly Breasts - breasts appear normal, no suspicious masses, no skin or nipple changes or axillary nodes Skin - normal coloration and turgor, no rashes, no suspicious skin lesions noted  PELVIC External genitalia -normal female Vulva -normal Vagina -normal secretions ring in place Cervix -multiparous status post LEEP  Uterus -nontender normal size shape and contour, not particularly mobile Adnexa -no significant pain mentioned by patient no masses [employer] -physiologic appearing secretions wet mount not done Rectal - rectal exam not indicated    Assessment & Plan:   A:  1. Resolved recent history of PID with left pyosalpinx, no acute symptoms at present  P:  1. Continue continuous NuvaRing use 2. Follow-up as needed    By signing my name below, I, [employer], attest that this documentation has been prepared under the direction and in the presence of Jonnie Kind,  MD. Electronically Signed: [place]. 06/16/19. 11:18 PM.  I personally performed the services described in this documentation, which was SCRIBED in my presence. The recorded information has been reviewed and considered accurate. It has been edited as necessary during review. Jonnie Kind, MD

## 2019-06-17 ENCOUNTER — Other Ambulatory Visit: Payer: Self-pay

## 2019-06-17 ENCOUNTER — Encounter: Payer: Self-pay | Admitting: Obstetrics and Gynecology

## 2019-06-17 ENCOUNTER — Ambulatory Visit: Payer: Medicaid Other | Admitting: Obstetrics and Gynecology

## 2019-06-17 VITALS — BP 121/86 | HR 65 | Wt 211.4 lb

## 2019-06-17 DIAGNOSIS — Z8742 Personal history of other diseases of the female genital tract: Secondary | ICD-10-CM | POA: Diagnosis not present

## 2019-07-27 ENCOUNTER — Ambulatory Visit (INDEPENDENT_AMBULATORY_CARE_PROVIDER_SITE_OTHER): Payer: Medicaid Other | Admitting: Obstetrics and Gynecology

## 2019-07-27 ENCOUNTER — Encounter: Payer: Self-pay | Admitting: Obstetrics and Gynecology

## 2019-07-27 VITALS — BP 132/93 | HR 65 | Ht 70.0 in | Wt 202.8 lb

## 2019-07-27 DIAGNOSIS — Z3202 Encounter for pregnancy test, result negative: Secondary | ICD-10-CM | POA: Diagnosis not present

## 2019-07-27 DIAGNOSIS — N926 Irregular menstruation, unspecified: Secondary | ICD-10-CM

## 2019-07-27 LAB — POCT URINE PREGNANCY: Preg Test, Ur: NEGATIVE

## 2019-07-27 MED ORDER — ESTRADIOL 1 MG PO TABS: 1.0000 mg | ORAL_TABLET | Freq: Every day | ORAL | 12 refills | Status: DC

## 2019-07-27 NOTE — Progress Notes (Signed)
Patient ID: Allison Miles, female   DOB: November 21, 1985, 34 y.o.   MRN: [id number]    [facility] Visit  @DATE @            Patient name: Allison Miles MRN [id number]  Date of birth: 09-28-1985  CC & HPI:  [name] is a 34 y.o. female presenting today for bleeding with Nuva Ring. In the beginning, the blood was brown and she had to wear panty liner for a week and a half. It turned into red blood on Sunday. She was also having intermittent pain around her ovaries, right>left. She is nearing the end of her second month using Nuvaring. The bleeding began a week after she started using the second ring. She was also a little bit constipated. The patient denies fever, chills or any other symptoms or complaints at this time.   ROS:  ROS  + BTB + constipation + ovarian tenderness, right>left - fever - chills All systems are negative except as noted in the HPI and PMH.   Pertinent History Reviewed:   Reviewed: Medical         Past Medical History:  Diagnosis Date  . ADD (attention deficit disorder) without hyperactivity   . Anemia   . Lumbar herniated disc                               Surgical Hx:    Past Surgical History:  Procedure Laterality Date  . CESAREAN SECTION    . DIAGNOSTIC LAPAROSCOPY     tacked pelvic ligaments due to prolapse and removed iud  . IUD REMOVAL    . UMBILICAL HERNIA REPAIR N/A 05/12/2013   Procedure: UMBILICAL HERNIA REPAIR WITHOUT MESH;  Surgeon: Scherry Ran, MD;  Location: [facility];  Service: General;  Laterality: N/A;  . UTERINE SUSPENSION    . VULVECTOMY Bilateral 05/12/2013   Procedure: Labial Reduction/Labioplasty;  Surgeon: Jonnie Kind, MD;  Location: [facility];  Service: Gynecology;  Laterality: Bilateral;   Medications: Reviewed & Updated - see associated section                       Current Outpatient Medications:  .  albuterol (VENTOLIN HFA) 108 (90 Base) MCG/ACT inhaler, Inhale 2 puffs into the lungs every 6 (six)  hours as needed. , Disp: , Rfl:  .  amphetamine-dextroamphetamine (ADDERALL) 20 MG tablet, Take by mouth., Disp: , Rfl:  .  etonogestrel-ethinyl estradiol (NUVARING) 0.12-0.015 MG/24HR vaginal ring, Insert vaginally and leave in place for 3 1/2 consecutive weeks, then replace. Repeat. Continuous contraceptive use., Disp: 3 each, Rfl: 4 .  famotidine (PEPCID) 20 MG tablet, Take by mouth., Disp: , Rfl:    Social History: Reviewed -  reports that she has been smoking cigarettes. She has a 9.00 pack-year smoking history. She has never used smokeless tobacco.  Objective Findings:  Vitals: Blood pressure (!) 132/93, pulse 65, height 5\' 10"  (1.778 m), weight 202 lb 12.8 oz (92 kg), last menstrual period 07/24/2019.  PHYSICAL EXAMINATION General appearance - alert, well appearing, and in no distress, oriented to person, place, and time and overweight Mental status - alert, oriented to person, place, and time, normal mood, behavior, speech, dress, motor activity, and thought processes, affect appropriate to mood  PELVIC External genitalia - normal Vulva - normal Vagina - minimal blood, NuvaRing in place  Assessment & Plan:   A:  1. BTB on NuvaRing  P:  1. Rx Estradiol 2. F/U prn  By signing my name below, I, Pietro Cassis, attest that this documentation has been prepared under the direction and in the presence of Tilda Burrow, MD. Electronically Signed: Pietro Cassis, Medical Scribe. 07/27/19. 3:51 PM.  I personally performed the services described in this documentation, which was SCRIBED in my presence. The recorded information has been reviewed and considered accurate. It has been edited as necessary during review. Tilda Burrow, MD

## 2019-10-05 ENCOUNTER — Ambulatory Visit: Payer: Medicaid Other | Admitting: Obstetrics and Gynecology

## 2020-01-31 NOTE — Progress Notes (Signed)
Patient states she took a pregnancy test that was positive but has now taken 2 more that are negative.  She has started bleeding today.  Advised for patient to have bloodwork done to check HCG level. Pt to go tomorrow.

## 2020-02-01 ENCOUNTER — Other Ambulatory Visit: Payer: Self-pay

## 2020-02-02 LAB — BETA HCG QUANT (REF LAB): hCG Quant: <1 m[IU]/mL

## 2020-02-22 ENCOUNTER — Telehealth: Payer: Self-pay | Admitting: Adult Health

## 2020-02-22 NOTE — Telephone Encounter (Signed)
Nurse visit scheduled - pt aware

## 2020-02-22 NOTE — Telephone Encounter (Signed)
Discharge for a couple weeks, getting worse & now has odor  Nurse & providers schedules are full, please advise & notify pt    [facility]

## 2020-02-23 ENCOUNTER — Other Ambulatory Visit: Payer: Medicaid Other

## 2020-02-29 ENCOUNTER — Other Ambulatory Visit: Payer: Self-pay

## 2020-02-29 ENCOUNTER — Other Ambulatory Visit (INDEPENDENT_AMBULATORY_CARE_PROVIDER_SITE_OTHER): Payer: Medicaid Other

## 2020-02-29 ENCOUNTER — Other Ambulatory Visit (HOSPITAL_COMMUNITY)
Admission: RE | Admit: 2020-02-29 | Discharge: 2020-02-29 | Disposition: A | Discharge status: Home / Self Care | Payer: Medicaid Other | Source: Ambulatory Visit | Attending: Obstetrics and Gynecology | Admitting: Obstetrics and Gynecology

## 2020-02-29 DIAGNOSIS — N898 Other specified noninflammatory disorders of vagina: Secondary | ICD-10-CM | POA: Insufficient documentation

## 2020-02-29 NOTE — Progress Notes (Signed)
   NURSE VISIT- VAGINITIS/STD/POC  SUBJECTIVE:  MERIA CRILLY is a 35 y.o. [id number] GYN patientfemale here for a vaginal swab for vaginitis screening, STD screen.  She reports the following symptoms: odor for 3 weeks. Denies abnormal vaginal bleeding, significant pelvic pain, fever, or UTI symptoms.  OBJECTIVE:  There were no vitals taken for this visit.  Appears well, in no apparent distress  ASSESSMENT: Vaginal swab for vaginitis screening  PLAN: Self-collected vaginal probe for Gonorrhea, Chlamydia, Trichomonas, Bacterial Vaginosis, Yeast sent to lab Treatment: to be determined once results are received Follow-up as needed if symptoms persist/worsen, or new symptoms develop  Zackarey Holleman A Cherene Dobbins  02/29/2020 4:03 PM

## 2020-02-29 NOTE — Progress Notes (Signed)
Agree with A & P. 

## 2020-03-02 LAB — CERVICOVAGINAL ANCILLARY ONLY
Bacterial Vaginitis (gardnerella): NEGATIVE
Candida Glabrata: NEGATIVE
Candida Vaginitis: NEGATIVE
Chlamydia: NEGATIVE
Comment: NEGATIVE
Comment: NEGATIVE
Comment: NEGATIVE
Comment: NEGATIVE
Comment: NEGATIVE
Comment: NORMAL
Neisseria Gonorrhea: NEGATIVE
Trichomonas: NEGATIVE

## 2020-04-25 ENCOUNTER — Encounter: Payer: Self-pay | Admitting: Adult Health

## 2020-04-25 ENCOUNTER — Ambulatory Visit: Payer: Medicaid Other | Admitting: Adult Health

## 2020-04-25 ENCOUNTER — Other Ambulatory Visit (HOSPITAL_COMMUNITY)
Admission: RE | Admit: 2020-04-25 | Discharge: 2020-04-25 | Disposition: A | Discharge status: Home / Self Care | Payer: Medicaid Other | Source: Ambulatory Visit | Attending: Adult Health | Admitting: Adult Health

## 2020-04-25 ENCOUNTER — Other Ambulatory Visit: Payer: Self-pay

## 2020-04-25 VITALS — BP 108/80 | HR 75 | Ht 68.25 in | Wt 209.0 lb

## 2020-04-25 DIAGNOSIS — Z01419 Encounter for gynecological examination (general) (routine) without abnormal findings: Secondary | ICD-10-CM

## 2020-04-25 DIAGNOSIS — R319 Hematuria, unspecified: Secondary | ICD-10-CM | POA: Diagnosis not present

## 2020-04-25 DIAGNOSIS — N390 Urinary tract infection, site not specified: Secondary | ICD-10-CM | POA: Diagnosis not present

## 2020-04-25 DIAGNOSIS — Z0000 Encounter for general adult medical examination without abnormal findings: Secondary | ICD-10-CM

## 2020-04-25 LAB — POCT URINALYSIS DIPSTICK
Glucose, UA: NEGATIVE
Ketones, UA: NEGATIVE
Nitrite, UA: POSITIVE
Protein, UA: NEGATIVE

## 2020-04-25 MED ORDER — SULFAMETHOXAZOLE-TRIMETHOPRIM 800-160 MG PO TABS: 1.0000 | ORAL_TABLET | Freq: Two times a day (BID) | ORAL | 0 refills | Status: DC

## 2020-04-25 MED ORDER — PRENATAL PLUS 27-1 MG PO TABS: 1.0000 | ORAL_TABLET | Freq: Every day | ORAL | 12 refills | Status: DC

## 2020-04-25 NOTE — Progress Notes (Signed)
Patient ID: Allison Miles, female   DOB: 1985/03/10, 35 y.o.   MRN: [id number] History of Present Illness: Allison Miles is a 35 year old white female, single, G2P2 in for a well woman gyn exam and pap. She has frequent UTIs and had PID last summer. Would like to get pregnant this year. PCP is Mady Gemma PA.   Current Medications, Allergies, Past Medical History, Past Surgical History, Family History and Social History were reviewed in [employer] record.     Review of Systems: Patient denies any headaches, hearing loss, fatigue, blurred vision, shortness of breath, chest pain, abdominal pain, problems with bowel movements, urination, or intercourse. No joint pain or mood swings.    Physical Exam:BP 108/80 (BP Location: Left Arm, Patient Position: Sitting, Cuff Size: Normal)   Pulse 75   Ht 5' 8.25" (1.734 m)   Wt 209 lb (94.8 kg)   LMP 04/16/2020   BMI 31.55 kg/m urine dipstick is +nitrates, trace leuks and 2+ blood. General:  Well developed, well nourished, no acute distress Skin:  Warm and dry,has tattoos  Neck:  Midline trachea, normal thyroid, good ROM, no lymphadenopathy Lungs; Clear to auscultation bilaterally Breast:  No dominant palpable mass, retraction, or nipple discharge Cardiovascular: Regular rate and rhythm Abdomen:  Soft, non tender, no hepatosplenomegaly Pelvic:  External genitalia is normal in appearance, no lesions.  The vagina is normal in appearance. Urethra has no lesions or masses. The cervix is bulbous, everted at os and has nabothian cyst at 5 0'clock, pap with GC/CHL and HR HPV performed.  Uterus is felt to be normal size, shape, and contour.  No adnexal masses or tenderness noted.Bladder is non tender, no masses felt. Extremities/musculoskeletal:  No swelling or varicosities noted, no clubbing or cyanosis Psych:  No mood changes, alert and cooperative,seems happy AA is 1 Fall risk is low PHQ 9 score is 6,no SI declines meds GAD 7  score is 7  [facility] - 04/25/20 0948      Pregnancy Intention Screening   Does the patient want to become pregnant in the next year? Yes    Does the patient's partner want to become pregnant in the next year? Yes    Would the patient like to discuss contraceptive options today? No      Contraception Wrap Up   Current Method Female Condom    End Method Female Condom    Contraception Counseling Provided No         Examination chaperoned by Malachy Mood LPN   Impression and Plan: 1. Hematuria, unspecified type UA C&S sent  2. Routine medical exam   3. Encounter for gynecological examination with Papanicolaou smear of cervix Pap sent Physical in 1 year Pap in 3 years if normal  4. Urinary tract infection without hematuria, site unspecified UA C&S sent Will rx septra ds Meds ordered this encounter  Medications  . sulfamethoxazole-trimethoprim (BACTRIM DS) 800-160 MG tablet    Sig: Take 1 tablet by mouth 2 (two) times daily. Take 1 bid    Dispense:  14 tablet    Refill:  0    Order Specific Question:   Supervising Provider    Answer:   Despina Hidden, LUTHER H [[id number]]  . prenatal vitamin w/FE, FA (PRENATAL 1 + 1) 27-1 MG TABS tablet    Sig: Take 1 tablet by mouth daily at 12 noon.    Dispense:  30 tablet    Refill:  12    Order Specific Question:  Supervising Provider    Answer:   Lazaro Arms [[id number]]   Start to decrease smoking and stop THC Will rx PNV

## 2020-04-26 LAB — URINALYSIS, ROUTINE W REFLEX MICROSCOPIC
Bilirubin, UA: NEGATIVE
Glucose, UA: NEGATIVE
Nitrite, UA: POSITIVE — AB
Specific Gravity, UA: 1.026 (ref 1.005–1.030)
Urobilinogen, Ur: 1 mg/dL (ref 0.2–1.0)
pH, UA: 6.5 (ref 5.0–7.5)

## 2020-04-26 LAB — MICROSCOPIC EXAMINATION
Casts: NONE SEEN /lpf
Epithelial Cells (non renal): >10 /hpf — AB (ref 0–10)

## 2020-04-28 LAB — URINE CULTURE

## 2020-04-30 ENCOUNTER — Telehealth: Payer: Self-pay | Admitting: Adult Health

## 2020-04-30 DIAGNOSIS — R87612 Low grade squamous intraepithelial lesion on cytologic smear of cervix (LGSIL): Secondary | ICD-10-CM

## 2020-04-30 LAB — CYTOLOGY - PAP
Chlamydia: NEGATIVE
Comment: NEGATIVE
Comment: NEGATIVE
Comment: NEGATIVE
Comment: NORMAL
HPV 16: NEGATIVE
HPV 18 / 45: NEGATIVE
High risk HPV: POSITIVE — AB
Neisseria Gonorrhea: NEGATIVE

## 2020-04-30 MED ORDER — NITROFURANTOIN MONOHYD MACRO 100 MG PO CAPS: 100.0000 mg | ORAL_CAPSULE | Freq: Two times a day (BID) | ORAL | 0 refills | Status: DC

## 2020-04-30 NOTE — Telephone Encounter (Signed)
Pt aware urine + Ecoli, septra ds will not cover it so stop that will rx macrobid. Also aware of pap, repeat in 1 year

## 2021-03-14 ENCOUNTER — Other Ambulatory Visit: Payer: Self-pay

## 2021-03-14 ENCOUNTER — Other Ambulatory Visit (HOSPITAL_COMMUNITY)
Admission: RE | Admit: 2021-03-14 | Discharge: 2021-03-14 | Disposition: A | Discharge status: Home / Self Care | Payer: Medicaid Other | Source: Ambulatory Visit | Attending: Obstetrics & Gynecology | Admitting: Obstetrics & Gynecology

## 2021-03-14 ENCOUNTER — Encounter: Payer: Self-pay | Admitting: Obstetrics & Gynecology

## 2021-03-14 ENCOUNTER — Ambulatory Visit: Payer: Medicaid Other | Admitting: Obstetrics & Gynecology

## 2021-03-14 VITALS — BP 116/81 | HR 68 | Ht 69.0 in | Wt 224.0 lb

## 2021-03-14 DIAGNOSIS — N730 Acute parametritis and pelvic cellulitis: Secondary | ICD-10-CM | POA: Diagnosis present

## 2021-03-14 MED ORDER — CEFTRIAXONE SODIUM 250 MG IJ SOLR
250.0000 mg | Freq: Once | INTRAMUSCULAR | Status: AC
  Administered 2021-03-14: 250 mg via INTRAMUSCULAR

## 2021-03-14 MED ORDER — KETOROLAC TROMETHAMINE 10 MG PO TABS: 10.0000 mg | ORAL_TABLET | Freq: Three times a day (TID) | ORAL | 0 refills | Status: DC | PRN

## 2021-03-14 MED ORDER — DOXYCYCLINE HYCLATE 100 MG PO TABS: 100.0000 mg | ORAL_TABLET | Freq: Two times a day (BID) | ORAL | 0 refills | Status: DC

## 2021-03-14 NOTE — Progress Notes (Signed)
Rocephin 250 mg given IM in left upper outer quadrant gluteus. Patient tolerated well.

## 2021-03-14 NOTE — Addendum Note (Signed)
Addended by: Annamarie Dawley on: 03/14/2021 03:07 PM   Modules accepted: Orders

## 2021-03-14 NOTE — Progress Notes (Signed)
Chief Complaint  Patient presents with   Pelvic Pain    Left side      36 y.o. [id number] Patient's last menstrual period was 03/07/2021. The current method of family planning is none.  Outpatient Encounter Medications as of 03/14/2021  Medication Sig   albuterol (VENTOLIN HFA) 108 (90 Base) MCG/ACT inhaler Inhale 2 puffs into the lungs every 6 (six) hours as needed.    doxycycline (VIBRA-TABS) 100 MG tablet Take 1 tablet (100 mg total) by mouth 2 (two) times daily.   ketorolac (TORADOL) 10 MG tablet Take 1 tablet (10 mg total) by mouth every 8 (eight) hours as needed.   [DISCONTINUED] nitrofurantoin, macrocrystal-monohydrate, (MACROBID) 100 MG capsule Take 1 capsule (100 mg total) by mouth 2 (two) times daily.   [DISCONTINUED] prenatal vitamin w/FE, FA (PRENATAL 1 + 1) 27-1 MG TABS tablet Take 1 tablet by mouth daily at 12 noon.   [EXPIRED] cefTRIAXone (ROCEPHIN) injection 250 mg    No facility-administered encounter medications on file as of 03/14/2021.    Subjective Pt started with pain yesterday before sex then hurt significantly worse thereafter No fever, temp 99.0 History of left hydrosalpinx 4/21  Past Medical History:  Diagnosis Date   ADD (attention deficit disorder) without hyperactivity    Anemia    Lumbar herniated disc     Past Surgical History:  Procedure Laterality Date   CESAREAN SECTION     DIAGNOSTIC LAPAROSCOPY     tacked pelvic ligaments due to prolapse and removed iud   IUD REMOVAL     UMBILICAL HERNIA REPAIR N/A 05/12/2013   Procedure: UMBILICAL HERNIA REPAIR WITHOUT MESH;  Surgeon: Marlane Hatcher, MD;  Location: [facility];  Service: General;  Laterality: N/A;   UTERINE SUSPENSION     VULVECTOMY Bilateral 05/12/2013   Procedure: Labial Reduction/Labioplasty;  Surgeon: Tilda Burrow, MD;  Location: [facility];  Service: Gynecology;  Laterality: Bilateral;    OB History     Gravida  2   Para  2   Term  2   Preterm  0   AB  0   Living  2       SAB  0   IAB  0   Ectopic  0   Multiple  0   Live Births  2           Allergies  Allergen Reactions   Morphine Other (See Comments)    anxious    Social History   Socioeconomic History   Marital status: Single    Spouse name: Not on file   Number of children: Not on file   Years of education: Not on file   Highest education level: Not on file  Occupational History   Not on file  Tobacco Use   Smoking status: Every Day    Packs/day: 1.00    Years: 9.00    Pack years: 9.00    Types: Cigarettes   Smokeless tobacco: Never  Vaping Use   Vaping Use: Some days  Substance and Sexual Activity   Alcohol use: No   Drug use: Yes    Types: Marijuana    Comment: occasional smoke marijuana   Sexual activity: Yes    Birth control/protection: None, Condom  Other Topics Concern   Not on file  Social History Narrative   Not on file   Social Determinants of Health   Financial Resource Strain: Medium Risk   Difficulty of Paying Living Expenses: Somewhat hard  Food Insecurity: No Food Insecurity   Worried About Programme researcher, broadcasting/film/video in the Last Year: Never true   Ran Out of Food in the Last Year: Never true  Transportation Needs: No Transportation Needs   Lack of Transportation (Medical): No   Lack of Transportation (Non-Medical): No  Physical Activity: Insufficiently Active   Days of Exercise per Week: 2 days   Minutes of Exercise per Session: 20 min  Stress: No Stress Concern Present   Feeling of Stress : Only a little  Social Connections: Moderately Integrated   Frequency of Communication with Friends and Family: Three times a week   Frequency of Social Gatherings with Friends and Family: Once a week   Attends Religious Services: 1 to 4 times per year   Active Member of [employer] or Organizations: No   Attends Engineer, structural: Never   Marital Status: Living with partner    Family History  Problem Relation Age of Onset   Stroke Paternal  Grandfather        also had ms   Cancer Maternal Grandmother        brain   Diabetes Maternal Uncle    Cancer Mother        oral and cervical   Anesthesia problems Neg Hx    Hypotension Neg Hx    Malignant hyperthermia Neg Hx    Pseudochol deficiency Neg Hx     Medications:       Current Outpatient Medications:    albuterol (VENTOLIN HFA) 108 (90 Base) MCG/ACT inhaler, Inhale 2 puffs into the lungs every 6 (six) hours as needed. , Disp: , Rfl:    doxycycline (VIBRA-TABS) 100 MG tablet, Take 1 tablet (100 mg total) by mouth 2 (two) times daily., Disp: 20 tablet, Rfl: 0   ketorolac (TORADOL) 10 MG tablet, Take 1 tablet (10 mg total) by mouth every 8 (eight) hours as needed., Disp: 15 tablet, Rfl: 0  Objective Blood pressure 116/81, pulse 68, height 5\' 9"  (1.753 m), weight 224 lb (101.6 kg), last menstrual period 03/07/2021.  General WDWN female NAD Vulva:  normal appearing vulva with no masses, tenderness or lesions Vagina:  normal mucosa, no discharge Cervix:  Normal no lesions tender to motion Uterus:  normal size, contour, position, consistency, mobility, tender to motion Adnexa: ovaries:present,  normal adnexa in size, nontender and no masses   Pertinent ROS No burning with urination, frequency or urgency No nausea, vomiting or diarrhea Nor fever chills or other constitutional symptoms   Labs or studies     Impression Diagnoses this Encounter::   ICD-10-CM   1. PID (acute pelvic inflammatory disease)  N73.0       Established relevant diagnosis(es):   Plan/Recommendations: Meds ordered this encounter  Medications   cefTRIAXone (ROCEPHIN) injection 250 mg   doxycycline (VIBRA-TABS) 100 MG tablet    Sig: Take 1 tablet (100 mg total) by mouth 2 (two) times daily.    Dispense:  20 tablet    Refill:  0   ketorolac (TORADOL) 10 MG tablet    Sig: Take 1 tablet (10 mg total) by mouth every 8 (eight) hours as needed.    Dispense:  15 tablet    Refill:  0     Labs or Scans Ordered: No orders of the defined types were placed in this encounter.   Management:: Rocephin 250 mg IM  Doxycycline 100 mg BID x 10 days  Follow up Return if symptoms worsen or fail to improve.  All questions were answered.

## 2021-03-18 ENCOUNTER — Encounter: Payer: Self-pay | Admitting: Obstetrics & Gynecology

## 2021-03-18 LAB — CERVICOVAGINAL ANCILLARY ONLY
Bacterial Vaginitis (gardnerella): POSITIVE — AB
Candida Glabrata: NEGATIVE
Candida Vaginitis: NEGATIVE
Chlamydia: NEGATIVE
Comment: NEGATIVE
Comment: NEGATIVE
Comment: NEGATIVE
Comment: NEGATIVE
Comment: NEGATIVE
Comment: NORMAL
Neisseria Gonorrhea: NEGATIVE
Trichomonas: NEGATIVE

## 2021-03-19 ENCOUNTER — Other Ambulatory Visit: Payer: Self-pay | Admitting: Obstetrics & Gynecology

## 2021-03-19 DIAGNOSIS — B9689 Other specified bacterial agents as the cause of diseases classified elsewhere: Secondary | ICD-10-CM

## 2021-03-19 MED ORDER — METRONIDAZOLE 0.75 % VA GEL: 1.0000 | Freq: Every day | VAGINAL | 0 refills | Status: AC

## 2021-03-19 NOTE — Progress Notes (Signed)
Rx sent in for BV 

## 2021-05-01 ENCOUNTER — Other Ambulatory Visit: Payer: Medicaid Other | Admitting: Adult Health

## 2021-05-08 ENCOUNTER — Other Ambulatory Visit (HOSPITAL_COMMUNITY)
Admission: RE | Admit: 2021-05-08 | Discharge: 2021-05-08 | Disposition: A | Discharge status: Home / Self Care | Payer: Medicaid Other | Source: Ambulatory Visit | Attending: Adult Health | Admitting: Adult Health

## 2021-05-08 ENCOUNTER — Other Ambulatory Visit: Payer: Self-pay

## 2021-05-08 ENCOUNTER — Encounter: Payer: Self-pay | Admitting: Adult Health

## 2021-05-08 ENCOUNTER — Ambulatory Visit: Payer: Medicaid Other | Admitting: Adult Health

## 2021-05-08 VITALS — BP 116/77 | HR 75 | Ht 69.5 in | Wt 226.5 lb

## 2021-05-08 DIAGNOSIS — Z124 Encounter for screening for malignant neoplasm of cervix: Secondary | ICD-10-CM | POA: Insufficient documentation

## 2021-05-08 DIAGNOSIS — Z8742 Personal history of other diseases of the female genital tract: Secondary | ICD-10-CM

## 2021-05-08 DIAGNOSIS — Z3202 Encounter for pregnancy test, result negative: Secondary | ICD-10-CM | POA: Diagnosis not present

## 2021-05-08 DIAGNOSIS — N939 Abnormal uterine and vaginal bleeding, unspecified: Secondary | ICD-10-CM | POA: Insufficient documentation

## 2021-05-08 DIAGNOSIS — N926 Irregular menstruation, unspecified: Secondary | ICD-10-CM

## 2021-05-08 DIAGNOSIS — Z113 Encounter for screening for infections with a predominantly sexual mode of transmission: Secondary | ICD-10-CM

## 2021-05-08 LAB — POCT URINE PREGNANCY: Preg Test, Ur: NEGATIVE

## 2021-05-08 NOTE — Progress Notes (Signed)
?  Subjective:  ?  ? Patient ID: Allison Miles, female   DOB: 05-18-1985, 36 y.o.   MRN: [id number] ? ?HPI ?Allison Miles is a 36 year old white female,single, G2P2 in complaining of irregular periods and bleeding since 04/20/21, spotting now. Had period 03/07/21 x 5 days, 03/24/21 x 5 days, was treated for PID 03/14/21 with doxycycline and rocephin and then BV with Metrogel, which made her have headache and mild nausea she says. She is not on birth control and is OK if gets pregnant. ?Lab Results  ?Component Value Date  ? 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 HPV NOT DETECTED 03/03/2018  ? 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  ? ?Review of Systems ?Irregular periods ?+bleeding still from 04/20/21 ?Reviewed past medical,surgical, social and family history. Reviewed medications and allergies.  ?   ?Objective:  ? Physical Exam ?BP 116/77 (BP Location: Left Arm, Patient Position: Sitting, Cuff Size: Large)   Pulse 75   Ht 5' 9.5" (1.765 m)   Wt 226 lb 8 oz (102.7 kg)   LMP 04/20/2021   BMI 32.97 kg/m?  UPT is negative  ?  Skin warm and dry.Pelvic: external genitalia is normal in appearance no lesions, vagina: +dark blood without odor,urethra has no lesions or masses noted, cervix is bulbous, irregular at os, nO CMT, pap with GC/CHL and HR HPV genotyping performed, uterus: normal size, shape and contour, non tender, no masses felt, adnexa: no masses or tenderness noted. Bladder is non tender and no masses felt. ?Fall risk is low ? [facility] - 05/08/21 1217   ? ?  ? Pregnancy Intention Screening  ? Does the patient want to become pregnant in the next year? Yes   ? Does the patient's partner want to become pregnant in the next year? Yes   ? Would the patient like to discuss contraceptive options today? No   ?  ? Contraception Wrap Up  ? Current Method Pregnant/Seeking Pregnancy   ? End Method Pregnant/Seeking Pregnancy   ? ?  ?  ? ?  ? Examination chaperoned by Lorraine Lax NP  student. ? ?Assessment:  ?   ?1. Pregnancy examination or test, negative result ?- POCT urine pregnancy ? ?2. Routine cervical smear ?Pap sent ?- Cytology - PAP( [facility]) ? ?3. Screening examination for STD (sexually transmitted disease) ?Will check labs  ?- RPR ?- HIV Antibody (routine testing w rflx) ?- Hepatitis C antibody ?- Hepatitis B surface antigen ? ?4. Irregular periods ?- Comprehensive metabolic panel ?- TSH ? ?5. Abnormal uterine bleeding (AUB) ?Will talk when results back, will try megace to stop bleeding, if not stopped by then  ?- CBC ?- Comprehensive metabolic panel ?- TSH ? ?6. History of abnormal cervical Pap smear ?Pap sent ? ? ?7. History of PID ? ?   ?Plan:  ?  Will talk when results back ?Follow up in 2 weeks  ?   ?

## 2021-05-10 LAB — COMPREHENSIVE METABOLIC PANEL
ALT: 13 IU/L (ref 0–32)
AST: 10 IU/L (ref 0–40)
Albumin/Globulin Ratio: 2.2 (ref 1.2–2.2)
Albumin: 4.6 g/dL (ref 3.8–4.8)
Alkaline Phosphatase: 62 IU/L (ref 44–121)
BUN/Creatinine Ratio: 13 (ref 9–23)
BUN: 12 mg/dL (ref 6–20)
Bilirubin Total: <0.2 mg/dL (ref 0.0–1.2)
CO2: 25 mmol/L (ref 20–29)
Calcium: 9.2 mg/dL (ref 8.7–10.2)
Chloride: 103 mmol/L (ref 96–106)
Creatinine, Ser: 0.95 mg/dL (ref 0.57–1.00)
Globulin, Total: 2.1 g/dL (ref 1.5–4.5)
Glucose: 101 mg/dL — ABNORMAL HIGH (ref 70–99)
Potassium: 4.4 mmol/L (ref 3.5–5.2)
Sodium: 141 mmol/L (ref 134–144)
Total Protein: 6.7 g/dL (ref 6.0–8.5)
eGFR: 80 mL/min/{1.73_m2} (ref >59)

## 2021-05-10 LAB — HEPATITIS C ANTIBODY: Hep C Virus Ab: NONREACTIVE

## 2021-05-10 LAB — RPR: RPR Ser Ql: NONREACTIVE

## 2021-05-10 LAB — CBC
Hematocrit: 43.1 % (ref 34.0–46.6)
Hemoglobin: 14.1 g/dL (ref 11.1–15.9)
MCH: 30.8 pg (ref 26.6–33.0)
MCHC: 32.7 g/dL (ref 31.5–35.7)
MCV: 94 fL (ref 79–97)
Platelets: 323 10*3/uL (ref 150–450)
RBC: 4.58 x10E6/uL (ref 3.77–5.28)
RDW: 12.4 % (ref 11.7–15.4)
WBC: 10.1 10*3/uL (ref 3.4–10.8)

## 2021-05-10 LAB — HIV ANTIBODY (ROUTINE TESTING W REFLEX): HIV Screen 4th Generation wRfx: NONREACTIVE

## 2021-05-10 LAB — TSH: TSH: 1.97 u[IU]/mL (ref 0.450–4.500)

## 2021-05-10 LAB — HEPATITIS B SURFACE ANTIGEN: Hepatitis B Surface Ag: NEGATIVE

## 2021-05-13 LAB — CYTOLOGY - PAP
Chlamydia: NEGATIVE
Comment: NEGATIVE
Comment: NEGATIVE
Comment: NORMAL
Diagnosis: NEGATIVE
High risk HPV: NEGATIVE
Neisseria Gonorrhea: NEGATIVE

## 2021-05-22 ENCOUNTER — Ambulatory Visit: Payer: Medicaid Other | Admitting: Adult Health

## 2021-06-06 ENCOUNTER — Other Ambulatory Visit: Payer: Medicaid Other | Admitting: Adult Health

## 2021-10-24 IMAGING — US US PELVIS COMPLETE
1 series · 13 of 25 positions shown · non-contrast
Comparison: CT study of 05/20/2019

CLINICAL DATA: Lower abdominal pain with concerning CT finding,
suggested ultrasound follow-up. Pain for 3 days.

EXAM:
TRANSABDOMINAL AND TRANSVAGINAL ULTRASOUND OF PELVIS
DOPPLER ULTRASOUND OF OVARIES
TECHNIQUE: Both transabdominal and transvaginal ultrasound examinations of the
pelvis were performed. Transabdominal technique was performed for
global imaging of the pelvis including uterus, ovaries, adnexal
regions, and pelvic cul-de-sac.
It was necessary to proceed with endovaginal exam following the
transabdominal exam to visualize the uterus and adnexa. Color and
duplex Doppler ultrasound was utilized to evaluate blood flow to the
ovaries.

[Series 1: us pelvis transvaginal non-ob (tv only) · 13 of 114 slices shown]
[im 1/114]
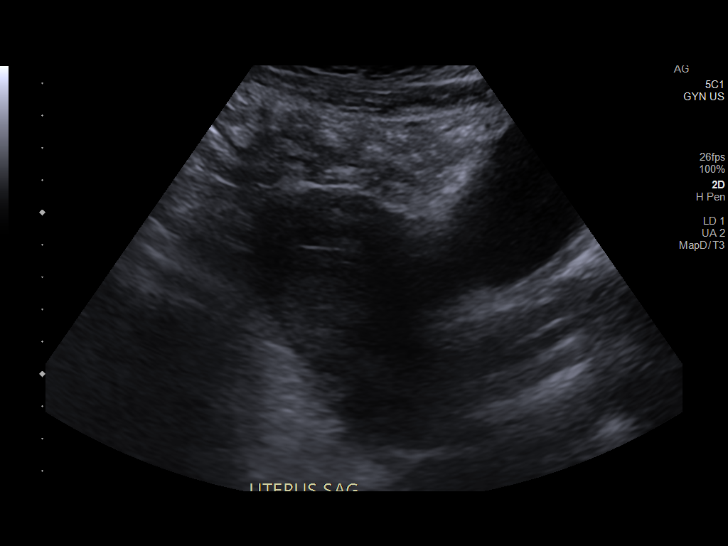
[im 10/114]
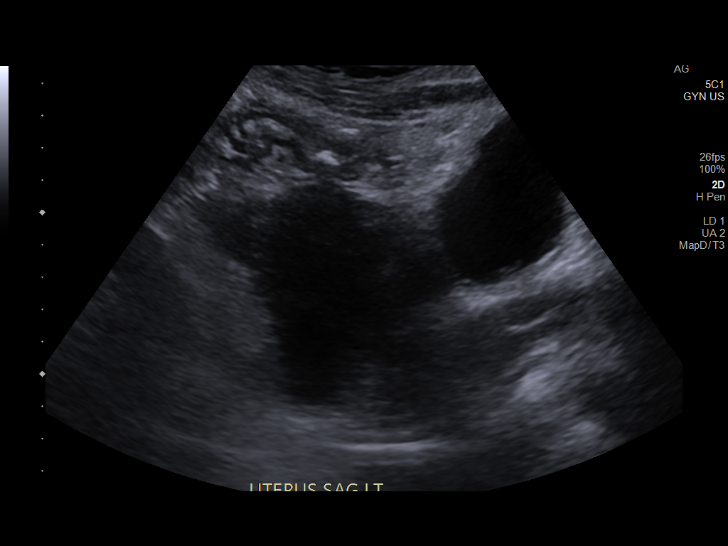
[im 19/114]
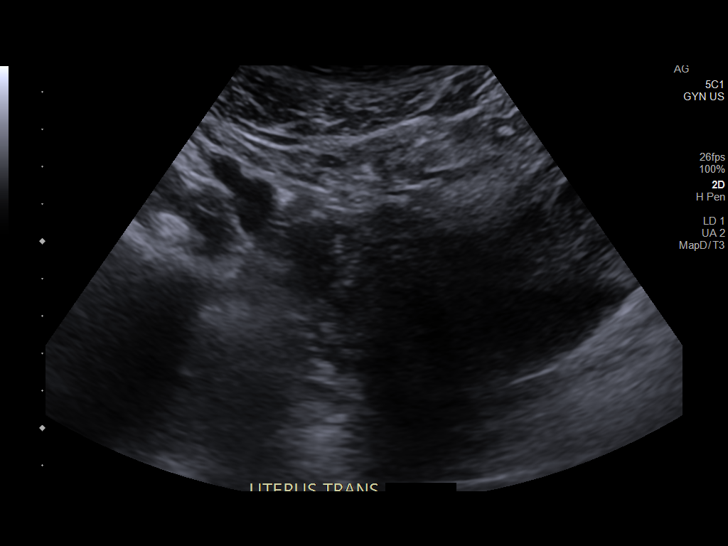
[im 29/114]
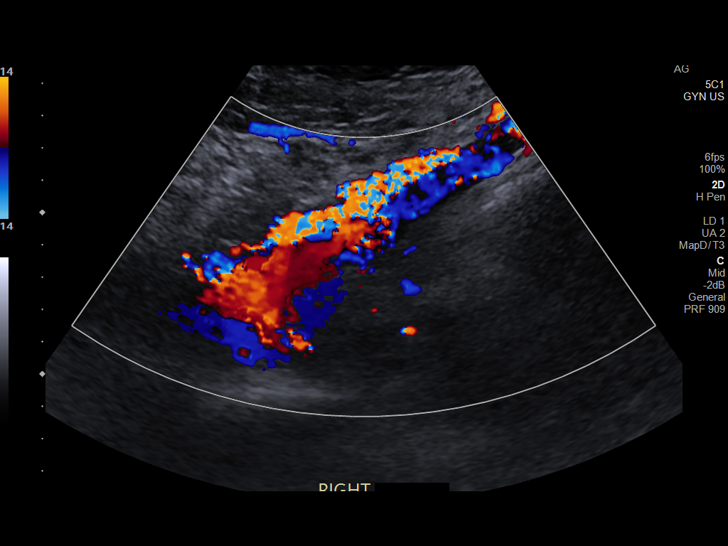
[im 38/114]
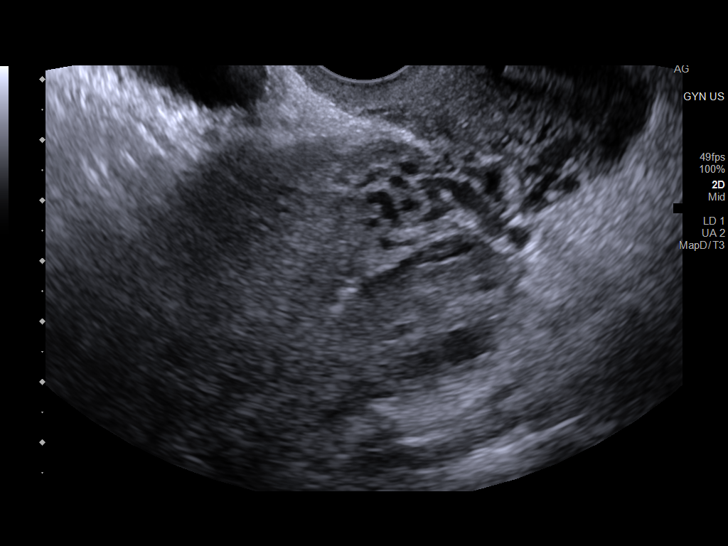
[im 48/114]
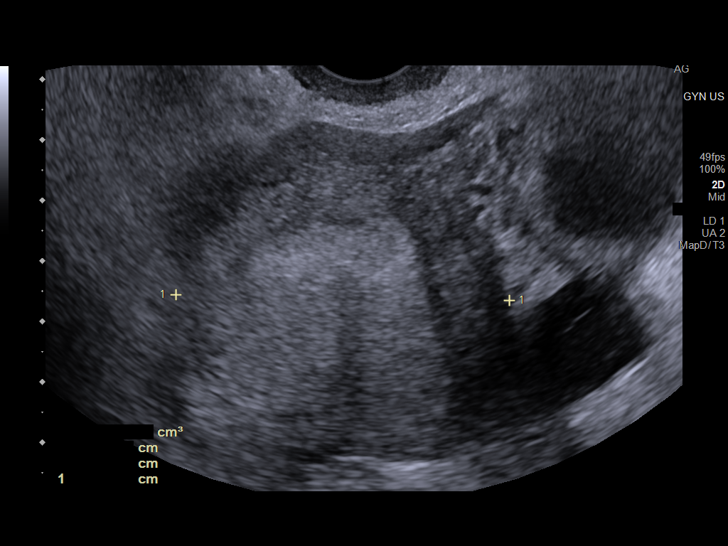
[im 57/114]
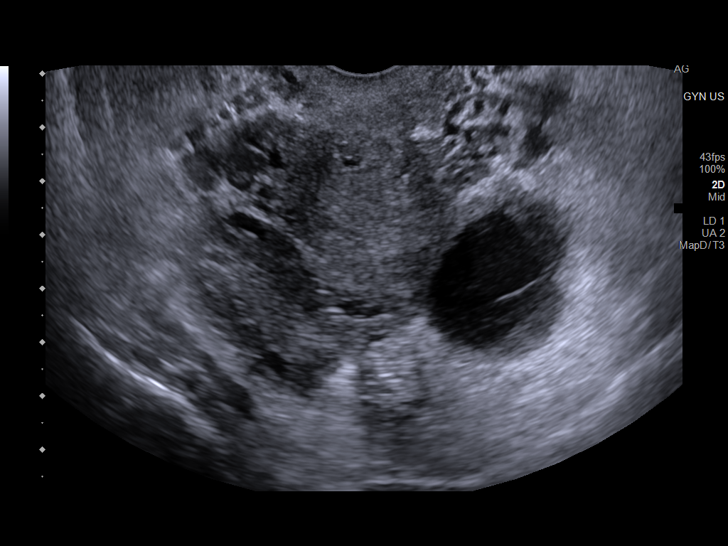
[im 66/114]
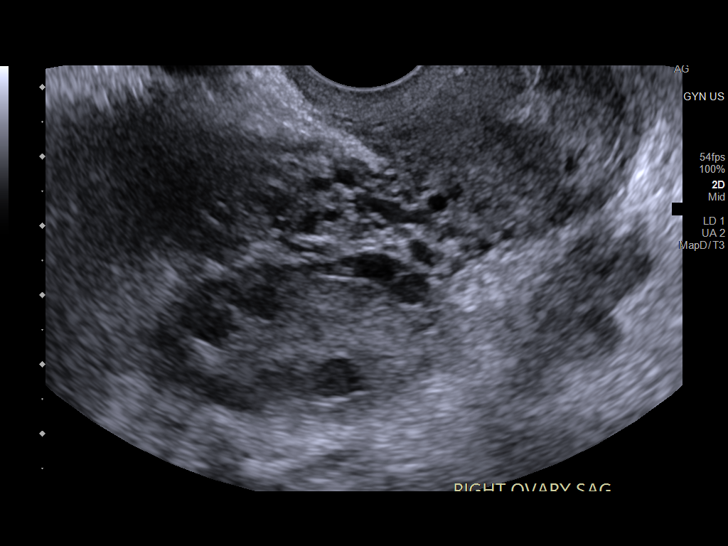
[im 76/114]
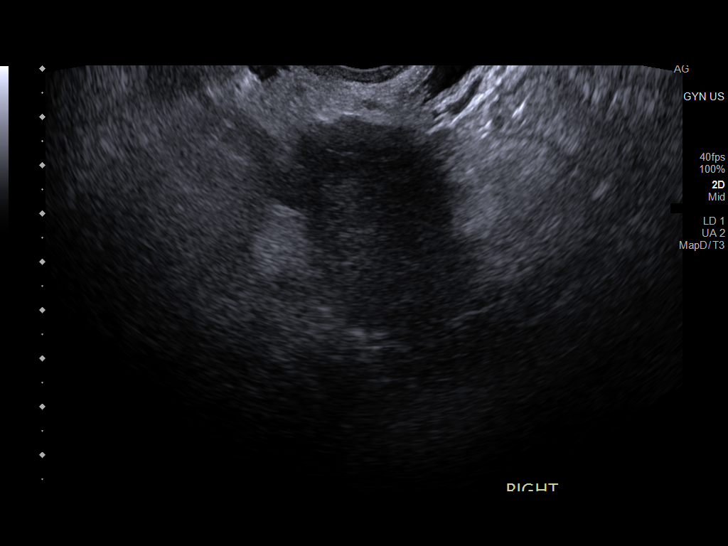
[im 85/114]
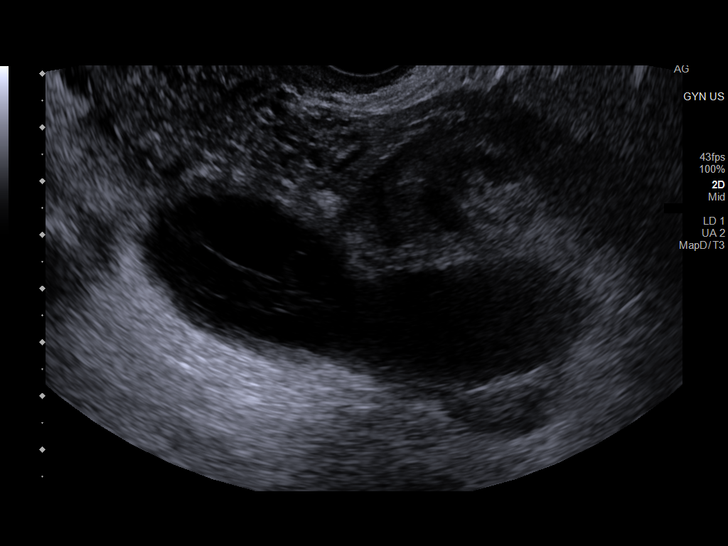
[im 95/114]
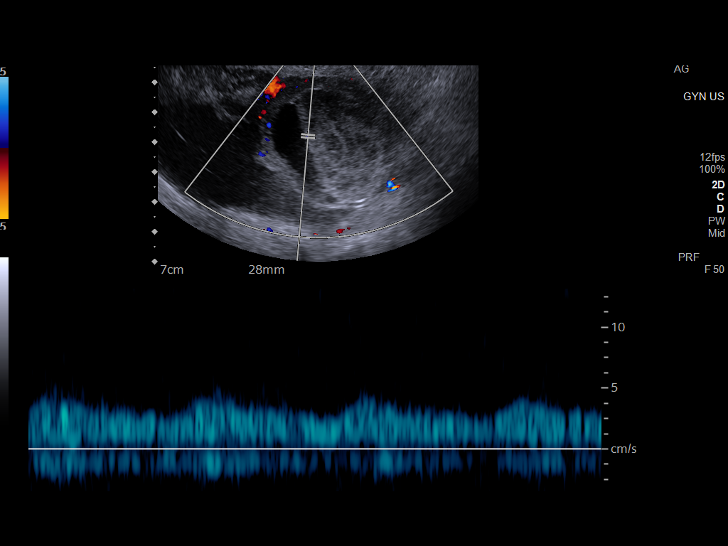
[im 104/114]
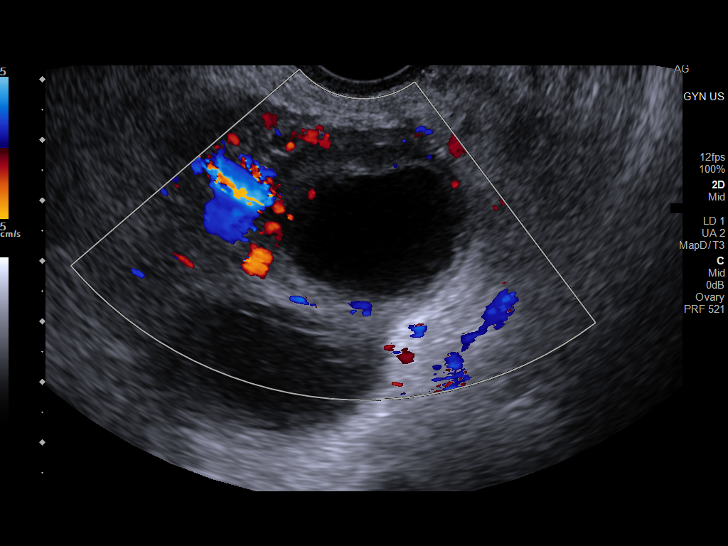
[im 114/114]
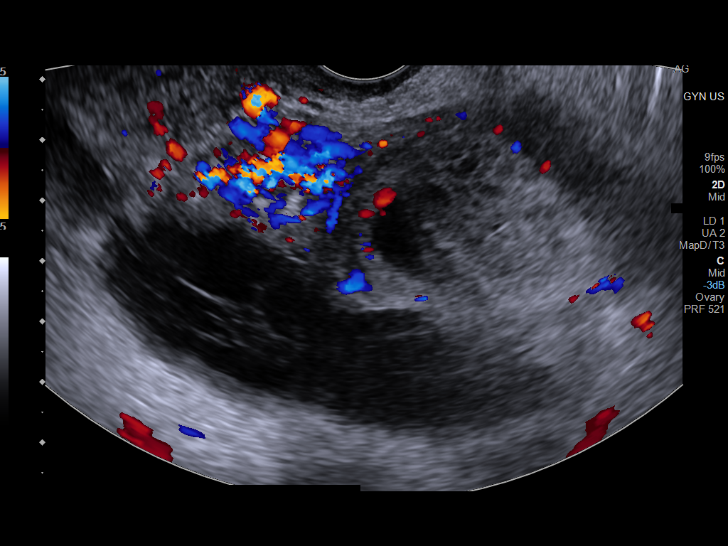

[13 of 25 positions shown; findings below may reference images not displayed]

FINDINGS: Uterus

Measurements: 9.9 x 5.2 x 5.5 cm = volume: 150 mL. No fibroids or
other mass visualized.

Endometrium

Thickness: 11 mm.  No focal abnormality visualized.

Right ovary

Measurements: 4.3 x 2.0 x 2.5 cm = volume: 11.2 mL. Normal
appearance/no adnexal mass.

Left ovary

Measurements: 4.0 x 4.9 x 4.6 cm = volume: 47 mL. Complex appearing
cystic area within the ovary showing heterogeneity. This measures
3.3x 1.1 x 3.0 cm

Septated elongated tubular structure extending to the left adnexa.

Pulsed Doppler evaluation of both ovaries demonstrates normal
low-resistance arterial and venous waveforms.

Increased flow in the left parametrial region and adnexa.

Other findings

No free fluid visualized though there is fluid in the cul-de-sac on
the recent CT.
IMPRESSION: 1. Findings tubo-ovarian complex with pyosalpinx.

## 2021-10-24 IMAGING — US US ART/VEN ABD/PELV/SCROTUM DOPPLER LTD
1 series · 13 of 25 positions shown · non-contrast
Comparison: CT study of 05/20/2019

CLINICAL DATA: Lower abdominal pain with concerning CT finding,
suggested ultrasound follow-up. Pain for 3 days.

EXAM:
TRANSABDOMINAL AND TRANSVAGINAL ULTRASOUND OF PELVIS
DOPPLER ULTRASOUND OF OVARIES
TECHNIQUE: Both transabdominal and transvaginal ultrasound examinations of the
pelvis were performed. Transabdominal technique was performed for
global imaging of the pelvis including uterus, ovaries, adnexal
regions, and pelvic cul-de-sac.
It was necessary to proceed with endovaginal exam following the
transabdominal exam to visualize the uterus and adnexa. Color and
duplex Doppler ultrasound was utilized to evaluate blood flow to the
ovaries.

[Series 1: us pelvis transvaginal non-ob (tv only) · 13 of 114 slices shown]
[im 1/114]
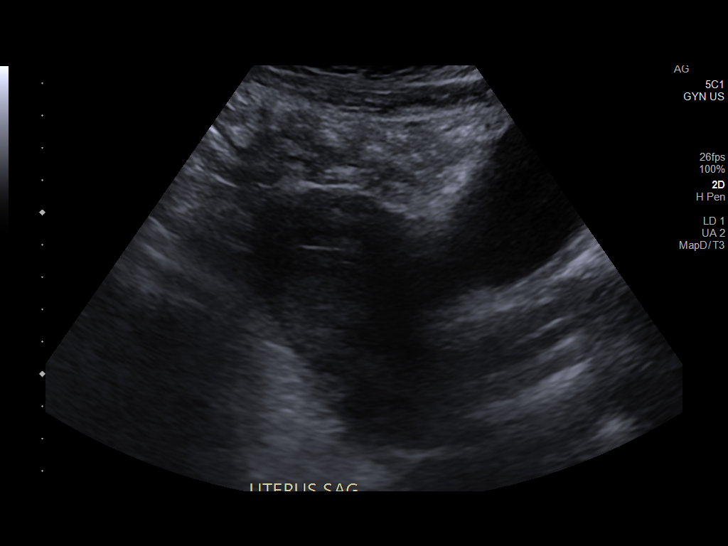
[im 10/114]
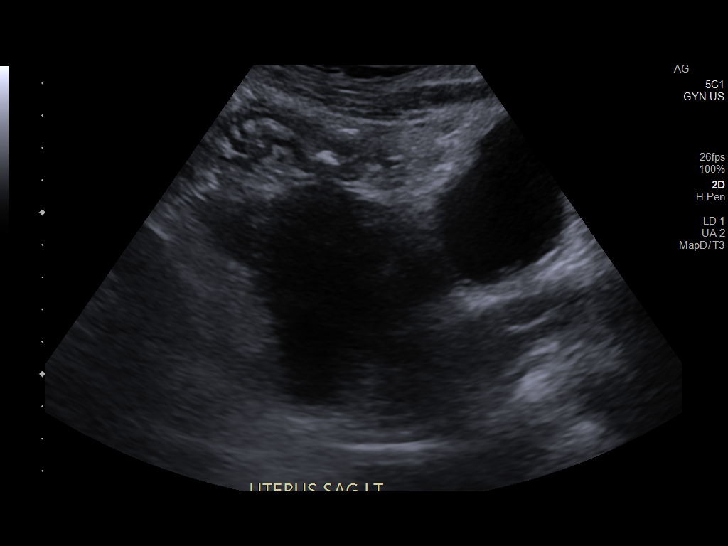
[im 19/114]
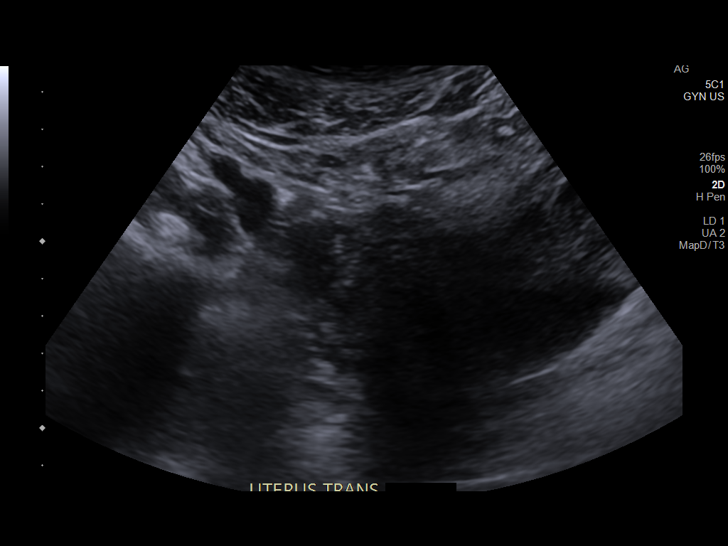
[im 29/114]
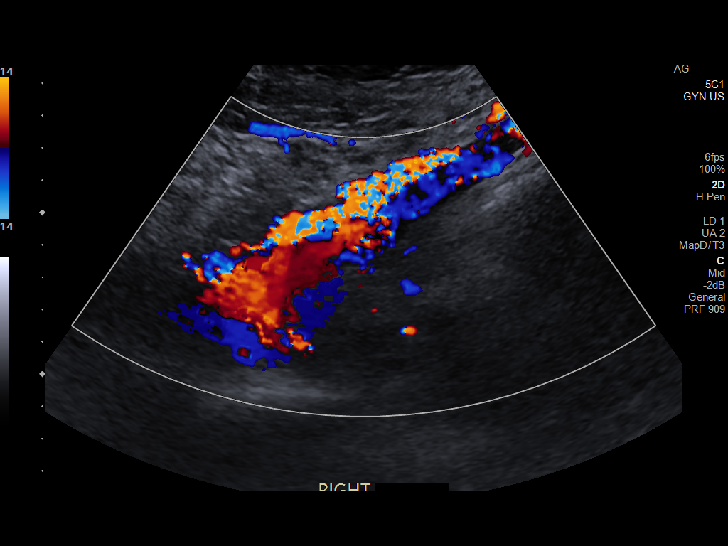
[im 38/114]
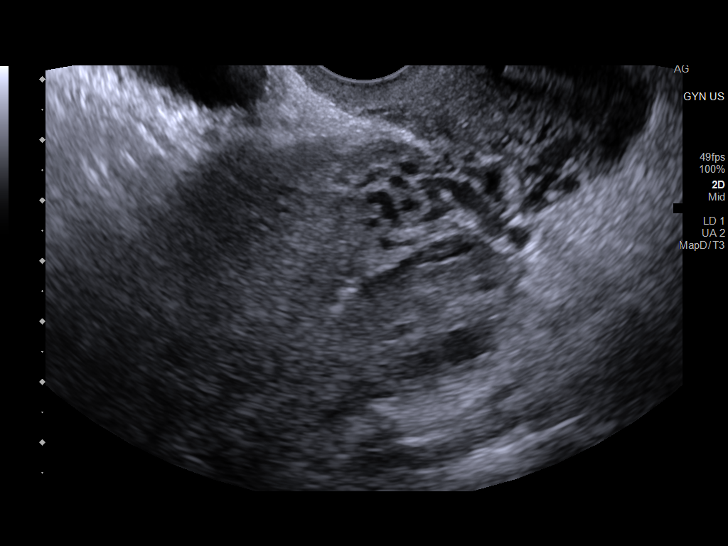
[im 48/114]
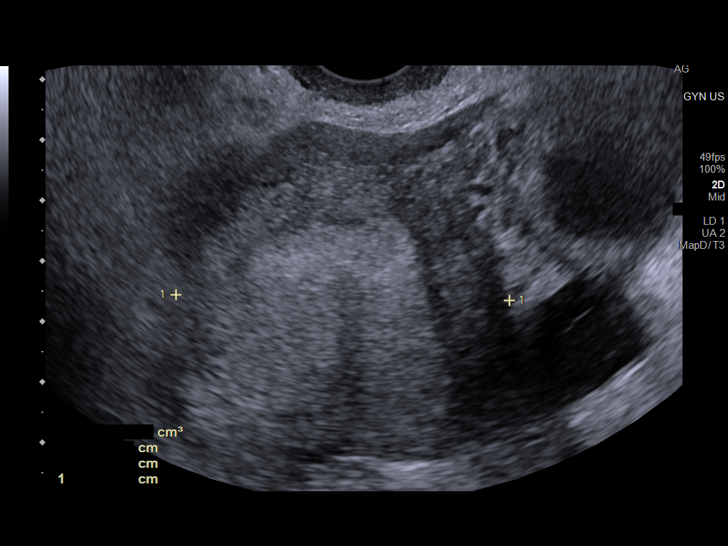
[im 57/114]
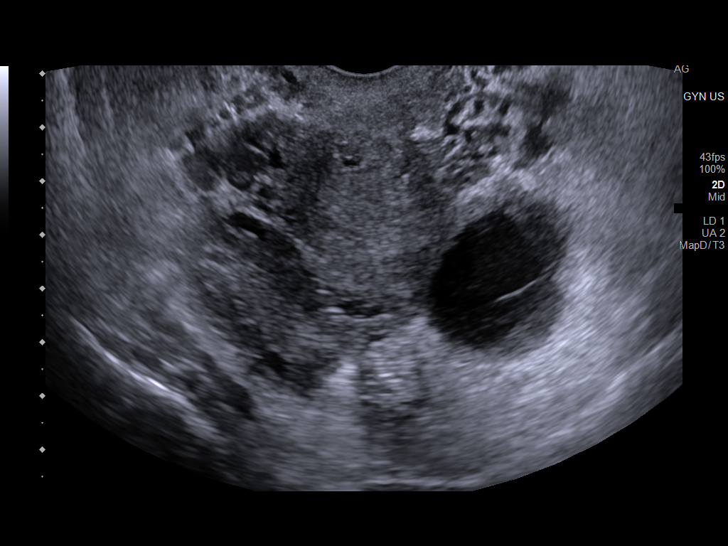
[im 66/114]
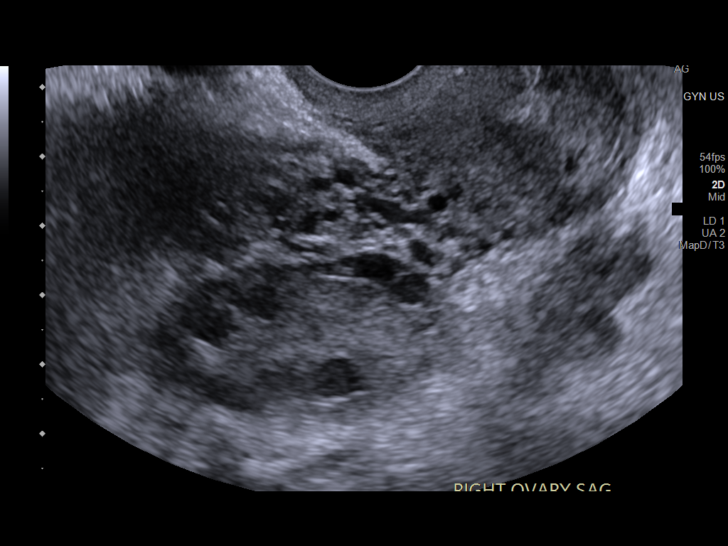
[im 76/114]
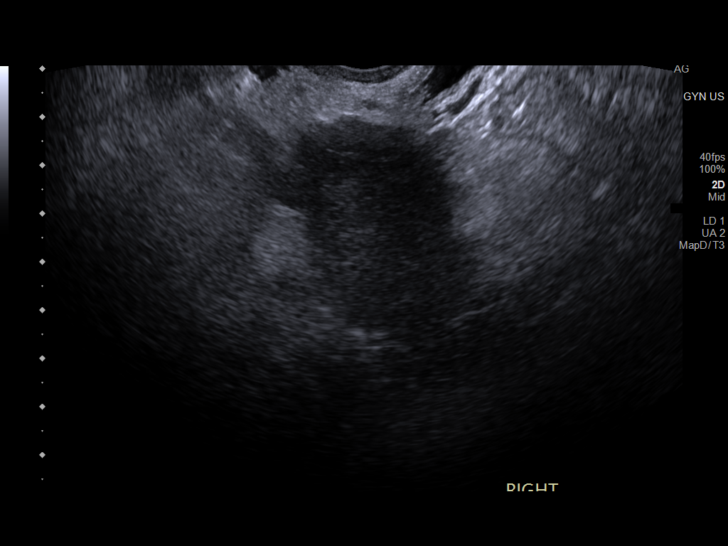
[im 85/114]
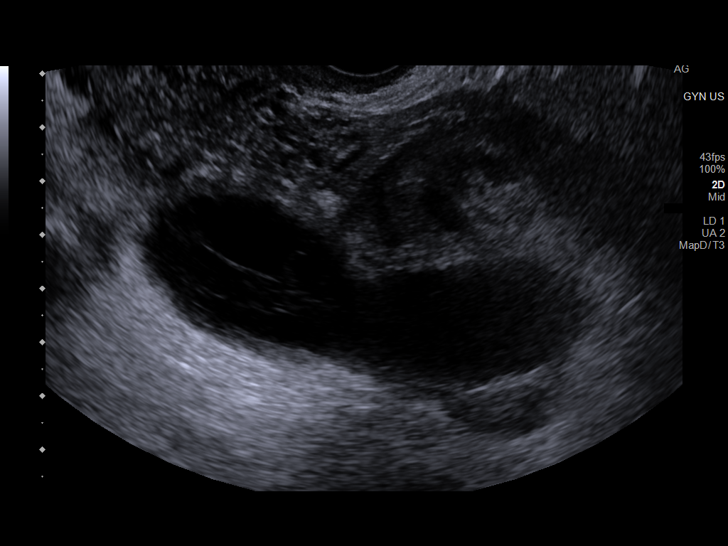
[im 95/114]
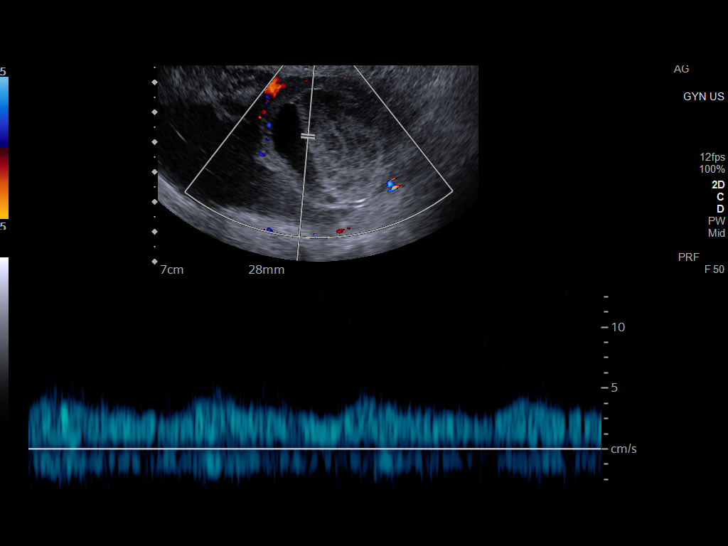
[im 104/114]
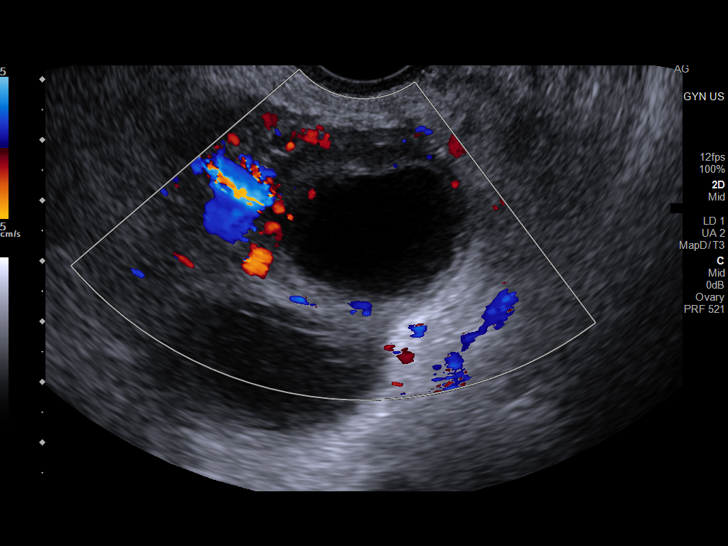
[im 114/114]
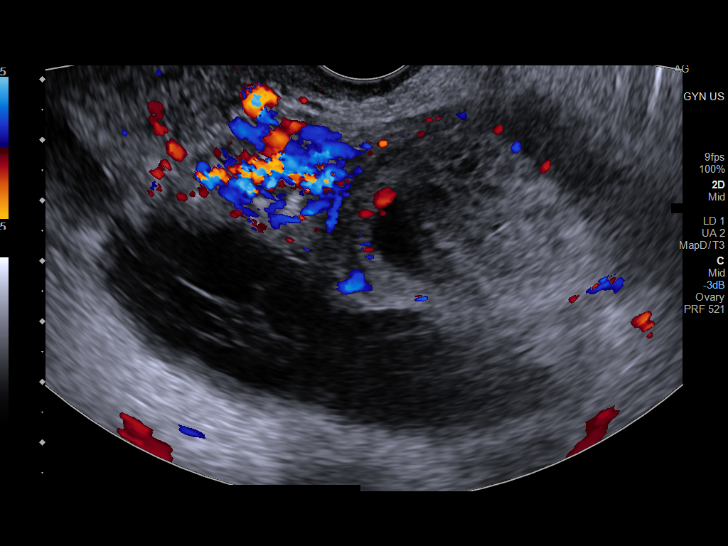

[13 of 25 positions shown; findings below may reference images not displayed]

FINDINGS: Uterus

Measurements: 9.9 x 5.2 x 5.5 cm = volume: 150 mL. No fibroids or
other mass visualized.

Endometrium

Thickness: 11 mm.  No focal abnormality visualized.

Right ovary

Measurements: 4.3 x 2.0 x 2.5 cm = volume: 11.2 mL. Normal
appearance/no adnexal mass.

Left ovary

Measurements: 4.0 x 4.9 x 4.6 cm = volume: 47 mL. Complex appearing
cystic area within the ovary showing heterogeneity. This measures
3.3x 1.1 x 3.0 cm

Septated elongated tubular structure extending to the left adnexa.

Pulsed Doppler evaluation of both ovaries demonstrates normal
low-resistance arterial and venous waveforms.

Increased flow in the left parametrial region and adnexa.

Other findings

No free fluid visualized though there is fluid in the cul-de-sac on
the recent CT.
IMPRESSION: 1. Findings tubo-ovarian complex with pyosalpinx.

## 2022-07-25 ENCOUNTER — Ambulatory Visit: Payer: Medicaid Other | Admitting: Obstetrics & Gynecology

## 2022-08-13 ENCOUNTER — Encounter: Payer: Self-pay | Admitting: Obstetrics & Gynecology

## 2022-08-19 ENCOUNTER — Encounter: Payer: Self-pay | Admitting: Obstetrics & Gynecology

## 2022-08-19 ENCOUNTER — Ambulatory Visit (INDEPENDENT_AMBULATORY_CARE_PROVIDER_SITE_OTHER): Payer: Medicaid Other | Admitting: Obstetrics & Gynecology

## 2022-08-19 ENCOUNTER — Other Ambulatory Visit (HOSPITAL_COMMUNITY)
Admission: RE | Admit: 2022-08-19 | Discharge: 2022-08-19 | Disposition: A | Discharge status: Home / Self Care | Payer: Medicaid Other | Source: Ambulatory Visit | Attending: Obstetrics & Gynecology | Admitting: Obstetrics & Gynecology

## 2022-08-19 VITALS — BP 119/71 | HR 61 | Ht 69.5 in | Wt 230.0 lb

## 2022-08-19 DIAGNOSIS — Z01419 Encounter for gynecological examination (general) (routine) without abnormal findings: Secondary | ICD-10-CM | POA: Insufficient documentation

## 2022-08-19 DIAGNOSIS — Z113 Encounter for screening for infections with a predominantly sexual mode of transmission: Secondary | ICD-10-CM

## 2022-08-19 NOTE — Progress Notes (Signed)
Subjective:     Allison Miles is a 37 y.o. female here for a routine exam.  Patient's last menstrual period was 08/03/2022. [id number] Birth Control Method:  none ok for pregnancy Menstrual Calendar(currently): regular in timing, ^clots  Current complaints: none.   Current acute medical issues:  none   Recent Gynecologic History Patient's last menstrual period was 08/03/2022. Last Pap: 2023,  normal Last mammogram: na,    Past Medical History:  Diagnosis Date   ADD (attention deficit disorder) without hyperactivity    Anemia    Asthma    Lumbar herniated disc    Vitamin D deficiency     Past Surgical History:  Procedure Laterality Date   CESAREAN SECTION     DIAGNOSTIC LAPAROSCOPY     tacked pelvic ligaments due to prolapse and removed iud   IUD REMOVAL     UMBILICAL HERNIA REPAIR N/A 05/12/2013   Procedure: UMBILICAL HERNIA REPAIR WITHOUT MESH;  Surgeon: Marlane Hatcher, MD;  Location: [facility];  Service: General;  Laterality: N/A;   UTERINE SUSPENSION     VULVECTOMY Bilateral 05/12/2013   Procedure: Labial Reduction/Labioplasty;  Surgeon: Tilda Burrow, MD;  Location: [facility];  Service: Gynecology;  Laterality: Bilateral;    OB History     Gravida  2   Para  2   Term  2   Preterm  0   AB  0   Living  2      SAB  0   IAB  0   Ectopic  0   Multiple  0   Live Births  2           Social History   Socioeconomic History   Marital status: Single    Spouse name: Not on file   Number of children: Not on file   Years of education: Not on file   Highest education level: Not on file  Occupational History   Not on file  Tobacco Use   Smoking status: Every Day    Packs/day: 1.00    Years: 9.00    Additional pack years: 0.00    Total pack years: 9.00    Types: Cigarettes   Smokeless tobacco: Never  Vaping Use   Vaping Use: Former  Substance and Sexual Activity   Alcohol use: Yes    Comment: occ   Drug use: Yes    Types: Marijuana     Comment: occasional smoke marijuana   Sexual activity: Yes    Birth control/protection: None  Other Topics Concern   Not on file  Social History Narrative   Not on file   Social Determinants of Health   Financial Resource Strain: Medium Risk (04/25/2020)   Overall Financial Resource Strain (CARDIA)    Difficulty of Paying Living Expenses: Somewhat hard  Food Insecurity: No Food Insecurity (04/25/2020)   Hunger Vital Sign    Worried About Running Out of Food in the Last Year: Never true    Ran Out of Food in the Last Year: Never true  Transportation Needs: No Transportation Needs (04/25/2020)   PRAPARE - Administrator, Civil Service (Medical): No    Lack of Transportation (Non-Medical): No  Physical Activity: Insufficiently Active (04/25/2020)   Exercise Vital Sign    Days of Exercise per Week: 2 days    Minutes of Exercise per Session: 20 min  Stress: No Stress Concern Present (04/25/2020)   [employer] of Occupational Health - Occupational Stress Questionnaire  Feeling of Stress : Only a little  Social Connections: Moderately Integrated (04/25/2020)   Social Connection and Isolation Panel [NHANES]    Frequency of Communication with Friends and Family: Three times a week    Frequency of Social Gatherings with Friends and Family: Once a week    Attends Religious Services: 1 to 4 times per year    Active Member of [employer] or Organizations: No    Attends Engineer, structural: Never    Marital Status: Living with partner    Family History  Problem Relation Age of Onset   Stroke Paternal Grandfather        also had ms   Cancer Maternal Grandmother        brain   Cancer Mother        oral and cervical   Cancer - Other Son        Langerhan's Cell Histiocytosis   Autoimmune disease Son    Diabetes Maternal Uncle    Cancer Paternal Uncle    Anesthesia problems Neg Hx    Hypotension Neg Hx    Malignant hyperthermia Neg Hx    Pseudochol deficiency Neg  Hx      Current Outpatient Medications:    albuterol (VENTOLIN HFA) 108 (90 Base) MCG/ACT inhaler, Inhale 2 puffs into the lungs every 6 (six) hours as needed. , Disp: , Rfl:    ALPRAZolam (XANAX) 0.25 MG tablet, Take by mouth. (Patient not taking: Reported on 08/19/2022), Disp: , Rfl:    Cholecalciferol 1.25 MG (50000 UT) capsule, TAKE 1 CAPSULE EVERY 7 DAYS (WEEKLY) (Patient not taking: Reported on 08/19/2022), Disp: , Rfl:   Review of Systems  Review of Systems  Constitutional: Negative for fever, chills, weight loss, malaise/fatigue and diaphoresis.  HENT: Negative for hearing loss, ear pain, nosebleeds, congestion, sore throat, neck pain, tinnitus and ear discharge.   Eyes: Negative for blurred vision, double vision, photophobia, pain, discharge and redness.  Respiratory: Negative for cough, hemoptysis, sputum production, shortness of breath, wheezing and stridor.   Cardiovascular: Negative for chest pain, palpitations, orthopnea, claudication, leg swelling and PND.  Gastrointestinal: negative for abdominal pain. Negative for heartburn, nausea, vomiting, diarrhea, constipation, blood in stool and melena.  Genitourinary: Negative for dysuria, urgency, frequency, hematuria and flank pain.  Musculoskeletal: Negative for myalgias, back pain, joint pain and falls.  Skin: Negative for itching and rash.  Neurological: Negative for dizziness, tingling, tremors, sensory change, speech change, focal weakness, seizures, loss of consciousness, weakness and headaches.  Endo/Heme/Allergies: Negative for environmental allergies and polydipsia. Does not bruise/bleed easily.  Psychiatric/Behavioral: Negative for depression, suicidal ideas, hallucinations, memory loss and substance abuse. The patient is not nervous/anxious and does not have insomnia.        Objective:  Blood pressure 119/71, pulse 61, height 5' 9.5" (1.765 m), weight 230 lb (104.3 kg), last menstrual period 08/03/2022.   Physical Exam   Vitals reviewed. Constitutional: She is oriented to person, place, and time. She appears well-developed and well-nourished.  HENT:  Head: Normocephalic and atraumatic.        Right Ear: External ear normal.  Left Ear: External ear normal.  Nose: Nose normal.  Mouth/Throat: Oropharynx is clear and moist.  Eyes: Conjunctivae and EOM are normal. Pupils are equal, round, and reactive to light. Right eye exhibits no discharge. Left eye exhibits no discharge. No scleral icterus.  Neck: Normal range of motion. Neck supple. No tracheal deviation present. No thyromegaly present.  Cardiovascular: Normal rate, regular rhythm, normal  heart sounds and intact distal pulses.  Exam reveals no gallop and no friction rub.   No murmur heard. Respiratory: Effort normal and breath sounds normal. No respiratory distress. She has no wheezes. She has no rales. She exhibits no tenderness.  GI: Soft. Bowel sounds are normal. She exhibits no distension and no mass. There is no tenderness. There is no rebound and no guarding.  Genitourinary:  Breasts no masses skin changes or nipple changes bilaterally      Vulva is normal without lesions Vagina is pink moist without discharge Cervix normal in appearance and pap is not done Uterus is normal size shape and contour Adnexa is negative with normal sized ovaries   Musculoskeletal: Normal range of motion. She exhibits no edema and no tenderness.  Neurological: She is alert and oriented to person, place, and time. She has normal reflexes. She displays normal reflexes. No cranial nerve deficit. She exhibits normal muscle tone. Coordination normal.  Skin: Skin is warm and dry. No rash noted. No erythema. No pallor.  Psychiatric: She has a normal mood and affect. Her behavior is normal. Judgment and thought content normal.       Medications Ordered at today's visit: No orders of the defined types were placed in this encounter.   Other orders placed at today's  visit: No orders of the defined types were placed in this encounter.     Assessment:    Normal Gyn exam.    Plan:    Contraception: none. Follow up in: 2 years.     No follow-ups on file.

## 2022-08-19 NOTE — Addendum Note (Signed)
Addended by: Moss Mc on: 08/19/2022 03:29 PM   Modules accepted: Orders

## 2022-08-21 LAB — CERVICOVAGINAL ANCILLARY ONLY
Bacterial Vaginitis (gardnerella): NEGATIVE
Candida Glabrata: NEGATIVE
Candida Vaginitis: NEGATIVE
Chlamydia: NEGATIVE
Comment: NEGATIVE
Comment: NEGATIVE
Comment: NEGATIVE
Comment: NEGATIVE
Comment: NEGATIVE
Comment: NORMAL
Neisseria Gonorrhea: NEGATIVE
Trichomonas: NEGATIVE

## 2023-05-01 ENCOUNTER — Encounter: Payer: Self-pay | Admitting: Adult Health

## 2023-05-01 ENCOUNTER — Ambulatory Visit: Admitting: Adult Health

## 2023-05-01 VITALS — BP 130/83 | HR 80 | Ht 69.5 in | Wt 232.5 lb

## 2023-05-01 DIAGNOSIS — R319 Hematuria, unspecified: Secondary | ICD-10-CM | POA: Diagnosis not present

## 2023-05-01 DIAGNOSIS — Z319 Encounter for procreative management, unspecified: Secondary | ICD-10-CM | POA: Diagnosis not present

## 2023-05-01 DIAGNOSIS — R350 Frequency of micturition: Secondary | ICD-10-CM

## 2023-05-01 DIAGNOSIS — R339 Retention of urine, unspecified: Secondary | ICD-10-CM | POA: Diagnosis not present

## 2023-05-01 LAB — POCT URINALYSIS DIPSTICK
Glucose, UA: NEGATIVE
Ketones, UA: NEGATIVE
Nitrite, UA: NEGATIVE
Protein, UA: NEGATIVE

## 2023-05-01 MED ORDER — SULFAMETHOXAZOLE-TRIMETHOPRIM 800-160 MG PO TABS: 1.0000 | ORAL_TABLET | Freq: Two times a day (BID) | ORAL | 0 refills | Status: DC

## 2023-05-01 NOTE — Progress Notes (Signed)
  Subjective:     Patient ID: Allison Miles, female   DOB: [birth year], 38 y.o.   MRN: [id number]  HPI Allison Miles is a 37 year old white female, with SO, G2P2002 in complaining of Urinary frequency for about 3 days and feels like bladder not empty and wants to get pregnant.     Component Value Date/Time   DIAGPAP  05/08/2021 1235    - Negative for intraepithelial lesion or malignancy (NILM)   DIAGPAP - Low grade squamous intraepithelial lesion (LSIL) (A) 04/25/2020 1004   DIAGPAP  03/03/2018 0000    NEGATIVE FOR INTRAEPITHELIAL LESIONS OR MALIGNANCY.   HPVHIGH Negative 05/08/2021 1235   HPVHIGH Positive (A) 04/25/2020 1004   ADEQPAP  05/08/2021 1235    Satisfactory for evaluation; transformation zone component PRESENT.   ADEQPAP  04/25/2020 1004    Satisfactory for evaluation; transformation zone component PRESENT.   ADEQPAP  03/03/2018 0000    Satisfactory for evaluation  endocervical/transformation zone component PRESENT.   PCP is Terri Piedra PA.  Review of Systems +urinary frequency and feels like not emptying bladder  Periods regular, every 25 days Has had unprotected sex for 2 years Reviewed past medical,surgical, social and family history. Reviewed medications and allergies.     Objective:   Physical Exam BP 130/83 (BP Location: Left Arm, Patient Position: Sitting, Cuff Size: Large)   Pulse 80   Ht 5' 9.5" (1.765 m)   Wt 232 lb 8 oz (105.5 kg)   LMP 04/13/2023 (Exact Date)   BMI 33.84 kg/m  urine dipstick trace leuks and trace blood    Skin warm and dry.  Lungs: clear to ausculation bilaterally. Cardiovascular: regular rate and rhythm. No tenderness over bladder, no CVAT. Fall risk is low  Upstream - 05/01/23 0911       Pregnancy Intention Screening   Does the patient want to become pregnant in the next year? Yes    Does the patient's partner want to become pregnant in the next year? Yes    Would the patient like to discuss contraceptive options today? No       Contraception Wrap Up   Current Method Pregnant/Seeking Pregnancy    End Method Pregnant/Seeking Pregnancy    Contraception Counseling Provided No             Assessment:     1. Urinary frequency (Primary) +UF for 3 days  UA C&S sent - POCT Urinalysis Dipstick - Urine Culture - Urinalysis, Routine w reflex microscopic  2. Hematuria, unspecified type Will rx septra ds  Meds ordered this encounter  Medications   sulfamethoxazole-trimethoprim (BACTRIM DS) 800-160 MG tablet    Sig: Take 1 tablet by mouth 2 (two) times daily. Take 1 bid    Dispense:  14 tablet    Refill:  0    Supervising Provider:   Duane Lope H [[id number]]    - POCT Urinalysis Dipstick - Urine Culture - Urinalysis, Routine w reflex microscopic  3. Urinary retention - POCT Urinalysis Dipstick - Urine Culture - Urinalysis, Routine w reflex microscopic  4. Patient desires pregnancy Take OTC PNV Will check progesterone level 05/04/23 Decrease cigarettes and THC  - Progesterone     Plan:     Follow up prn

## 2023-05-02 LAB — URINALYSIS, ROUTINE W REFLEX MICROSCOPIC
Bilirubin, UA: NEGATIVE
Glucose, UA: NEGATIVE
Ketones, UA: NEGATIVE
Nitrite, UA: NEGATIVE
Protein,UA: NEGATIVE
Specific Gravity, UA: 1.023 (ref 1.005–1.030)
Urobilinogen, Ur: 0.2 mg/dL (ref 0.2–1.0)
pH, UA: 6 (ref 5.0–7.5)

## 2023-05-02 LAB — MICROSCOPIC EXAMINATION: Casts: NONE SEEN /LPF

## 2023-05-05 ENCOUNTER — Other Ambulatory Visit: Payer: Self-pay | Admitting: Adult Health

## 2023-05-05 LAB — URINE CULTURE

## 2023-05-05 LAB — PROGESTERONE: Progesterone: 6.9 ng/mL

## 2023-05-05 MED ORDER — NITROFURANTOIN MONOHYD MACRO 100 MG PO CAPS: 100.0000 mg | ORAL_CAPSULE | Freq: Two times a day (BID) | ORAL | 0 refills | Status: DC

## 2023-05-05 NOTE — Progress Notes (Signed)
 Urine culture + E coli resistant to septra ds so stop and will rx macrobid

## 2023-05-13 ENCOUNTER — Other Ambulatory Visit: Payer: Self-pay | Admitting: Adult Health

## 2023-05-13 DIAGNOSIS — Z319 Encounter for procreative management, unspecified: Secondary | ICD-10-CM

## 2023-05-13 NOTE — Progress Notes (Signed)
 Ck progesterone level 05/28/23

## 2023-09-14 ENCOUNTER — Other Ambulatory Visit: Payer: Self-pay | Admitting: Medical Genetics

## 2023-10-14 ENCOUNTER — Other Ambulatory Visit (HOSPITAL_COMMUNITY)
Admission: RE | Admit: 2023-10-14 | Discharge: 2023-10-14 | Disposition: A | Discharge status: Home / Self Care | Payer: Self-pay | Source: Ambulatory Visit | Attending: Oncology | Admitting: Oncology

## 2023-10-20 LAB — GENECONNECT MOLECULAR SCREEN: Genetic Analysis Overall Interpretation: NEGATIVE

## 2023-12-28 ENCOUNTER — Emergency Department (HOSPITAL_COMMUNITY)

## 2023-12-28 ENCOUNTER — Other Ambulatory Visit: Payer: Self-pay

## 2023-12-28 ENCOUNTER — Emergency Department (HOSPITAL_COMMUNITY)
Admission: EM | Admit: 2023-12-28 | Discharge: 2023-12-29 | Disposition: A | Discharge status: Home / Self Care | Attending: Emergency Medicine | Admitting: Emergency Medicine

## 2023-12-28 ENCOUNTER — Encounter (HOSPITAL_COMMUNITY): Payer: Self-pay | Admitting: Emergency Medicine

## 2023-12-28 DIAGNOSIS — N739 Female pelvic inflammatory disease, unspecified: Secondary | ICD-10-CM | POA: Diagnosis not present

## 2023-12-28 DIAGNOSIS — D72829 Elevated white blood cell count, unspecified: Secondary | ICD-10-CM | POA: Insufficient documentation

## 2023-12-28 DIAGNOSIS — R109 Unspecified abdominal pain: Secondary | ICD-10-CM | POA: Diagnosis present

## 2023-12-28 LAB — URINALYSIS, ROUTINE W REFLEX MICROSCOPIC
Bilirubin Urine: NEGATIVE
Glucose, UA: NEGATIVE mg/dL
Ketones, ur: NEGATIVE mg/dL
Leukocytes,Ua: NEGATIVE
Nitrite: NEGATIVE
Protein, ur: NEGATIVE mg/dL
Specific Gravity, Urine: 1.024 (ref 1.005–1.030)
pH: 5 (ref 5.0–8.0)

## 2023-12-28 LAB — COMPREHENSIVE METABOLIC PANEL WITH GFR
ALT: 8 U/L (ref 0–44)
AST: <10 U/L — ABNORMAL LOW (ref 15–41)
Albumin: 4.7 g/dL (ref 3.5–5.0)
Alkaline Phosphatase: 61 U/L (ref 38–126)
Anion gap: 12 (ref 5–15)
BUN: 12 mg/dL (ref 6–20)
CO2: 27 mmol/L (ref 22–32)
Calcium: 9.9 mg/dL (ref 8.9–10.3)
Chloride: 101 mmol/L (ref 98–111)
Creatinine, Ser: 0.86 mg/dL (ref 0.44–1.00)
GFR, Estimated: >60 mL/min (ref >60)
Glucose, Bld: 74 mg/dL (ref 70–99)
Potassium: 3.7 mmol/L (ref 3.5–5.1)
Sodium: 139 mmol/L (ref 135–145)
Total Bilirubin: 0.3 mg/dL (ref 0.0–1.2)
Total Protein: 7.7 g/dL (ref 6.5–8.1)

## 2023-12-28 LAB — CBC
HCT: 45.3 % (ref 36.0–46.0)
Hemoglobin: 15.2 g/dL — ABNORMAL HIGH (ref 12.0–15.0)
MCH: 31.5 pg (ref 26.0–34.0)
MCHC: 33.6 g/dL (ref 30.0–36.0)
MCV: 94 fL (ref 80.0–100.0)
Platelets: 324 K/uL (ref 150–400)
RBC: 4.82 MIL/uL (ref 3.87–5.11)
RDW: 13.2 % (ref 11.5–15.5)
WBC: 13 K/uL — ABNORMAL HIGH (ref 4.0–10.5)
nRBC: 0 % (ref 0.0–0.2)

## 2023-12-28 LAB — PREGNANCY, URINE: Preg Test, Ur: NEGATIVE

## 2023-12-28 LAB — LIPASE, BLOOD: Lipase: 19 U/L (ref 11–51)

## 2023-12-28 MED ORDER — HYDROCODONE-ACETAMINOPHEN 5-325 MG PO TABS: 1.0000 | ORAL_TABLET | Freq: Once | ORAL | Status: DC

## 2023-12-28 MED ORDER — METRONIDAZOLE 500 MG/100ML IV SOLN
500.0000 mg | Freq: Once | INTRAVENOUS | Status: AC
  Administered 2023-12-28: 500 mg via INTRAVENOUS
  Filled 2023-12-28: qty 100

## 2023-12-28 MED ORDER — OXYCODONE-ACETAMINOPHEN 5-325 MG PO TABS
1.0000 | ORAL_TABLET | Freq: Once | ORAL | Status: AC
  Administered 2023-12-28: 1 via ORAL
  Filled 2023-12-28: qty 1

## 2023-12-28 MED ORDER — CEFTRIAXONE SODIUM 1 G IJ SOLR
1.0000 g | Freq: Once | INTRAMUSCULAR | Status: AC
  Administered 2023-12-28: 1 g via INTRAMUSCULAR
  Filled 2023-12-28: qty 10

## 2023-12-28 MED ORDER — ONDANSETRON HCL 4 MG/2ML IJ SOLN
4.0000 mg | Freq: Once | INTRAMUSCULAR | Status: AC
  Administered 2023-12-28: 4 mg via INTRAVENOUS
  Filled 2023-12-28: qty 2

## 2023-12-28 MED ORDER — METRONIDAZOLE 500 MG PO TABS: 500.0000 mg | ORAL_TABLET | Freq: Two times a day (BID) | ORAL | 0 refills | Status: DC

## 2023-12-28 MED ORDER — SODIUM CHLORIDE 0.9 % IV SOLN
100.0000 mg | Freq: Two times a day (BID) | INTRAVENOUS | Status: DC
  Administered 2023-12-28: 100 mg via INTRAVENOUS
  Filled 2023-12-28 (×3): qty 100

## 2023-12-28 MED ORDER — STERILE WATER FOR INJECTION IJ SOLN
INTRAMUSCULAR | Status: AC
  Administered 2023-12-28: 2 mL
  Filled 2023-12-28: qty 10

## 2023-12-28 MED ORDER — IBUPROFEN 800 MG PO TABS
800.0000 mg | ORAL_TABLET | Freq: Once | ORAL | Status: AC
  Administered 2023-12-28: 800 mg via ORAL
  Filled 2023-12-28: qty 1

## 2023-12-28 MED ORDER — DOXYCYCLINE HYCLATE 100 MG PO CAPS: 100.0000 mg | ORAL_CAPSULE | Freq: Two times a day (BID) | ORAL | 0 refills | Status: DC

## 2023-12-28 MED ORDER — IOHEXOL 300 MG/ML  SOLN
100.0000 mL | Freq: Once | INTRAMUSCULAR | Status: AC | PRN
  Administered 2023-12-28: 100 mL via INTRAVENOUS

## 2023-12-28 NOTE — ED Triage Notes (Signed)
 Pt c/o abd pain since Friday, reports difficulty passing gas and difficulty having BM but states she is not constipated

## 2023-12-28 NOTE — ED Provider Notes (Signed)
 [place] EMERGENCY DEPARTMENT AT [facility] Provider Note   CSN: [id number] Arrival date & time: 12/28/23  1303     Patient presents with: Abdominal Pain   Allison Miles is a 38 y.o. female with a history of pelvic inflammatory disease who presents to the ED with abdominal pain x 3 days. The symptoms started as generalized stomach cramping and now patient reports she is having intense suprapubic pain. Patient states that she has a history of PID and was hospitalized for it for multiple days in 2021. She does have wax/wane nausea. She reports no vomiting, diarrhea, or blood in stool. Patient denies vaginal discharge or bleeding. Patient denies urinary symptoms. Patient states that she has had subjective fevers at home. No recent travel. No sick contacts.    Abdominal Pain      Prior to Admission medications   Medication Sig Start Date End Date Taking? Authorizing Provider  doxycycline  (VIBRAMYCIN ) 100 MG capsule Take 1 capsule (100 mg total) by mouth 2 (two) times daily for 10 days. 12/28/23 01/07/24 Yes Arya Boxley L, PA  metroNIDAZOLE  (FLAGYL ) 500 MG tablet Take 1 tablet (500 mg total) by mouth 2 (two) times daily for 10 days. 12/28/23 01/07/24 Yes Tobie Perdue L, PA  albuterol (VENTOLIN HFA) 108 (90 Base) MCG/ACT inhaler Inhale 2 puffs into the lungs every 6 (six) hours as needed.  01/05/18   [provider]    Allergies: Morphine     Review of Systems  Gastrointestinal:  Positive for abdominal pain.    Updated Vital Signs BP 96/66   Pulse 72   Temp 97.9 F (36.6 C) (Oral)   Resp 14   Ht 5' 9 (1.753 m)   Wt 108.9 kg   LMP 12/08/2023 (Approximate)   SpO2 98%   BMI 35.44 kg/m   Physical Exam Vitals and nursing note reviewed.  Constitutional:      General: She is not in acute distress.    Appearance: Normal appearance.  HENT:     Head: Normocephalic and atraumatic.  Eyes:     Extraocular Movements: Extraocular movements intact.      Conjunctiva/sclera: Conjunctivae normal.     Pupils: Pupils are equal, round, and reactive to light.  Cardiovascular:     Rate and Rhythm: Normal rate and regular rhythm.     Pulses: Normal pulses.  Pulmonary:     Effort: Pulmonary effort is normal. No respiratory distress.  Abdominal:     General: Abdomen is flat.     Palpations: Abdomen is soft.     Tenderness: There is abdominal tenderness.     Comments: TTP suprapubic and left lower quadrant.  No CVA tenderness.  No guarding, rebound, distention.  Musculoskeletal:        General: Normal range of motion.     Cervical back: Normal range of motion.  Skin:    General: Skin is warm and dry.     Capillary Refill: Capillary refill takes less than 2 seconds.  Neurological:     General: No focal deficit present.     Mental Status: She is alert. Mental status is at baseline.  Psychiatric:        Mood and Affect: Mood normal.     (all labs ordered are listed, but only abnormal results are displayed) Labs Reviewed  COMPREHENSIVE METABOLIC PANEL WITH GFR - Abnormal; Notable for the following components:      Result Value   AST <10 (*)    All other components within  normal limits  CBC - Abnormal; Notable for the following components:   WBC 13.0 (*)    Hemoglobin 15.2 (*)    All other components within normal limits  URINALYSIS, ROUTINE W REFLEX MICROSCOPIC - Abnormal; Notable for the following components:   Hgb urine dipstick MODERATE (*)    Bacteria, UA RARE (*)    All other components within normal limits  LIPASE, BLOOD  PREGNANCY, URINE    EKG: None  Radiology: CT ABDOMEN PELVIS W CONTRAST Result Date: 12/28/2023 EXAM: CT ABDOMEN AND PELVIS WITH CONTRAST 12/28/2023 06:11:38 PM TECHNIQUE: CT of the abdomen and pelvis was performed with the administration of 100 mL of iohexol  (OMNIPAQUE ) 300 MG/ML solution. Multiplanar reformatted images are provided for review. Automated exposure control, iterative reconstruction, and/or  weight-based adjustment of the mA/kV was utilized to reduce the radiation dose to as low as reasonably achievable. COMPARISON: 05/20/2019 CLINICAL HISTORY: Abdominal pain, acute, nonlocalized. FINDINGS: LOWER CHEST: No acute abnormality. LIVER: The liver is unremarkable. GALLBLADDER AND BILE DUCTS: Gallbladder is unremarkable. No biliary ductal dilatation. SPLEEN: No acute abnormality. PANCREAS: No acute abnormality. ADRENAL GLANDS: Interval enlargement of an indeterminate 2.3 cm nodule within the left adrenal gland measuring 51 Hounsfield units in density, previously measuring 13 mm. Recommend adrenal washout CT or chemical shift MR. KIDNEYS, URETERS AND BLADDER: No stones in the kidneys or ureters. No hydronephrosis. No perinephric or periureteral stranding. Urinary bladder is unremarkable. GI AND BOWEL: Appendix normal. The stomach, small bowel, and large bowel are otherwise unremarkable. There is no bowel obstruction. PERITONEUM AND RETROPERITONEUM: No ascites. No free air. VASCULATURE: Aorta is normal in caliber. LYMPH NODES: No lymphadenopathy. REPRODUCTIVE ORGANS: Thick-walled rim-enhancing cystic collections are noted within the adnexa bilaterally measuring 8.0 x 4.5 cm on the left (calculated volume 121.9 cm\S\3) and 3.2 x 3.8 cm on the right (calculated volume 22.1 cm\S\3), image 74 / 2. These have increased in size since prior examination and, on the right, demonstrate increasing mural thickening which may reflect superinfection with resultant developing pyosalpinx. Mild infiltration of the deep pelvic fat surrounding these structures suggests mild inflammatory change. Correlate clinically with pelvic examination. BONES AND SOFT TISSUES: Degenerative changes are seen at L5-S1. No acute bone abnormality. Small fat-containing umbilical hernia. No other focal soft tissue abnormality. IMPRESSION: 1. Thick-walled rim-enhancing adnexal cystic collections bilaterally, increased in size since prior; right-sided  collection with increasing mural thickening suggesting superinfection. Correlate clinically with pelvic examination 2. Interval enlargement of an indeterminate 2.3 cm left adrenal nodule (51 HU), previously 1.3 cm. Recommend adrenal washout CT or chemical shift MR Electronically signed by: Dorethia Molt MD 12/28/2023 07:36 PM EST RP Workstation: [id number]   US  Pelvis Complete Result Date: 12/28/2023 CLINICAL DATA:  Pelvic pain.  History of hydrosalpinx. EXAM: TRANSABDOMINAL ULTRASOUND OF PELVIS TECHNIQUE: Transabdominal ultrasound examination of the pelvis was performed including evaluation of the uterus, ovaries, adnexal regions, and pelvic cul-de-sac. COMPARISON:  Pelvic ultrasound 06/09/2019. FINDINGS: Uterus Measurements: 9.7 x 5.1 x 7.0 cm = volume: 180 mL. No fibroids or other mass visualized. Endometrium Thickness: 4.8 mm.  No focal abnormality visualized. Right ovary Measurements: 4.0 x 3.7 x 4.1 cm = volume: 32 mL. There is a 3.0 x 3.1 x 2.7 cm cystic area in the right ovary containing internal low-level echoes. Left ovary Measurements: 5.7 x 4.2 x 4.4 cm = volume: 55 mL. The left ovary appears within normal limits. Adjacent to the left ovary there is a complex cystic structure, slightly tubular containing internal echoes and septations measuring 8.3  x 4.5 x 5.6 cm. No internal vascularity seen in this region. Other findings:  No abnormal free fluid. IMPRESSION: 1. Complex cystic structure adjacent to the left ovary measuring 8.3 x 4.5 x 5.6 cm. This may represent a complex paraovarian cyst or hydrosalpinx. Superimposed infection is not excluded. 2. Complex cystic area in the right ovary measuring 3.0 x 3.1 x 2.7 cm. This may represent a hemorrhagic cyst or endometrioma. 3. Recommend follow-up pelvic ultrasound in 6-8 weeks to ensure resolution. 4. No evidence of ovarian torsion. 5. No free fluid in the pelvis. Electronically Signed   By: Greig Pique M.D.   On: 12/28/2023 18:51     Procedures    Medications Ordered in the ED  doxycycline  (VIBRAMYCIN ) 100 mg in sodium chloride  0.9 % 250 mL IVPB (100 mg Intravenous New Bag/Given 12/28/23 2221)  ondansetron  (ZOFRAN ) injection 4 mg (4 mg Intravenous Given 12/28/23 1740)  oxyCODONE -acetaminophen  (PERCOCET/ROXICET) 5-325 MG per tablet 1 tablet (1 tablet Oral Given 12/28/23 1740)  iohexol  (OMNIPAQUE ) 300 MG/ML solution 100 mL (100 mLs Intravenous Contrast Given 12/28/23 1749)  cefTRIAXone  (ROCEPHIN ) injection 1 g (1 g Intramuscular Given 12/28/23 2126)  metroNIDAZOLE  (FLAGYL ) IVPB 500 mg (0 mg Intravenous Stopped 12/28/23 2356)  sterile water  (preservative free) injection (2 mLs  Given 12/28/23 2133)  ibuprofen  (ADVIL ) tablet 800 mg (800 mg Oral Given 12/28/23 2200)    Clinical Course as of 12/29/23 0024  Mon Dec 28, 2023  1648 AST(!): <10 [ML]    Clinical Course User Index [ML] Willma Duwaine CROME, [place]                                 Medical Decision Making Amount and/or Complexity of Data Reviewed Labs: ordered. Decision-making details documented in ED Course. Radiology: ordered.  Risk Prescription drug management.   Patient presents to the ED for concern of abdominal pain, this involves an extensive number of treatment options, and is a complaint that carries with it a high risk of complications and morbidity.    The differential diagnosis includes: GYN/reproductive etiology  Gastrointestinal etiology Infectious etiology  Additional history obtained: Additional history obtained from  Outside Medical Records and Past Admission  External records from outside source obtained and reviewed including medical history, surgical history, medications, allergies.  Visit in 2021 where patient was hospitalized for tubo-ovarian abscess was reviewed during this visit. The patient was a reliable historian, providing a clear, detailed, and consistent account of the presenting symptoms and relevant medical history. The information was  obtained directly from the patient and statements were documented in the patient's own words when possible. No discrepancies were noted between the history provided and available collateral sources.     Lab Tests: I ordered, and personally interpreted labs.   The pertinent results include:  CMP -no acute findings CBC -slight leukocytosis Lipase -WNL Urinalysis - slight microhematuria  Imaging Studies ordered: I ordered imaging studies including: CT abdomen pelvis with contrast US  pelvis complete I independently visualized and interpreted imaging which showed: Thick-walled rim-enhancing adnexal cystic collections bilaterally, increased in size since prior; right-sided collection with increasing mural thickening suggesting superinfection. Interval enlargement of an indeterminate 2.3 cm left adrenal nodule (51 HU), previously 1.3 cm.  Complex cystic structure adjacent to the left ovary measuring 8.3 x 4.5 x 5.6 cm. This may represent a complex paraovarian cyst or hydrosalpinx. Superimposed infection is not excluded. Complex cystic area in the right ovary measuring 3.0  x 3.1 x 2.7cm. This may represent a hemorrhagic cyst or endometrioma. No evidence of ovarian torsion. No free fluid in the pelvis. I agree with the radiologist interpretation  Medicines ordered and prescription drug management: I ordered medications: Oxycodone -acetaminophen  5 mg for pain Ibuprofen  tablet 800 mg for pain Ondansetron  4 mg for nausea cefTRIAXone  1g, metroNIDAZOLE  500mg , and Doxyclycline 100mg  IV for infection  Reevaluation of the patient after these medicines showed that the patient had improvement with pain medication regimen. I have reviewed the patients home medicines and have made adjustments as needed  Consultations Obtained:   Consultation was obtained from the OBGYN service regarding the evaluation and management of this patient's imaging findings of possible ovarian cysts with infectious process. Dr.  Jayne was contacted by phone and the case was discussed in detail, including relevant history, physical findings, and diagnostic results. Dr. Jayne recommended IV antibiotics and close follow-up with him in office on Thursday.  Problem List / ED Course: Problem List: Abdominal pain PID Ovarian cysts with possible infectious etiology Emergency Department Course: The patient presented with abdominal pain. Initial assessment included history, physical exam, and review of prior medical records.  CMP and CBC were evaluated for infectious or metabolic etiology-slight leukocytosis at 13.0.  Urinalysis was completed to rule out other infectious etiology and found microhematuria-patient is asymptomatic.  Lipase was found WNL.  Imaging showed bilateral ovarian cyst with possible infectious etiology.  Given patient's history of tubo-ovarian abscess, and laboratory studies and imaging similar to when patient was admitted in 2021, Dr. Jayne was consulted on further evaluation and treatment with the patient.  Dr. Jayne suggested IV antibiotics and close follow-up with him in office on Thursday.  Pain and nausea were controlled during this visit.  All vitals stayed within normal limits.   Disposition: Given the patient's past medical history, history of present illness, physical exam findings, laboratory and imaging results, and consultation with Dr. Jayne, plan to discharge patient on antibiotic regimen with close follow-up in office on Thursday with OBGYN.  Clinical Assessment:    Working diagnosis: PID, ovarian cysts Disposition Plan: The patient is medically stable for discharge from the Emergency Department at this time. Vital signs are within normal limits, and the patient is alert, oriented, and in no acute distress. Diagnostic evaluation has been completed with no findings necessitating hospital admission or further emergent workup.  Communication:   Patient informed of disposition decision and rationale.  Questions addressed.  The results and clinical impression were discussed with the patient at bedside and the patient demonstrated understanding.     Final diagnoses:  Pelvic inflammatory disease (PID)    ED Discharge Orders          Ordered    doxycycline  (VIBRAMYCIN ) 100 MG capsule  2 times daily        12/28/23 2344    metroNIDAZOLE  (FLAGYL ) 500 MG tablet  2 times daily        12/28/23 2344               Willma Duwaine CROME, [place] 12/29/23 0026    Towana Ozell BROCKS, MD 12/29/23 [phone]

## 2023-12-28 NOTE — ED Notes (Signed)
 Provided pt with sandwich, drink, and warm blanket

## 2023-12-28 NOTE — Discharge Instructions (Addendum)
 Thank you for visiting the Emergency Department today. It was a pleasure to be part of your healthcare team. Your results showed possible ovarian cysts requiring close follow up with gynecology. You have been treated with IV antibiotics, and you have been prescribed oral antibiotics, you should take your medications as directed. If you have any questions about your medicines, please call your pharmacy or healthcare provider. At home, rest, hydrate, resume normal diet, and take over the counter pain medication such as tylenol  and ibuprofen  as needed for pain. It is important to watch for warning signs such as worsening pain or fever. If any of these happen, return to the Emergency Department or call 911. Thank you for trusting us  with your health.

## 2024-01-04 ENCOUNTER — Encounter: Payer: Self-pay | Admitting: Obstetrics & Gynecology

## 2024-01-04 ENCOUNTER — Ambulatory Visit: Admitting: Obstetrics & Gynecology

## 2024-01-04 VITALS — BP 127/83 | HR 85 | Ht 69.5 in | Wt 242.0 lb

## 2024-01-04 DIAGNOSIS — N83201 Unspecified ovarian cyst, right side: Secondary | ICD-10-CM

## 2024-01-04 DIAGNOSIS — N730 Acute parametritis and pelvic cellulitis: Secondary | ICD-10-CM

## 2024-01-04 DIAGNOSIS — N83202 Unspecified ovarian cyst, left side: Secondary | ICD-10-CM | POA: Diagnosis not present

## 2024-01-04 DIAGNOSIS — N739 Female pelvic inflammatory disease, unspecified: Secondary | ICD-10-CM | POA: Diagnosis not present

## 2024-01-04 MED ORDER — METRONIDAZOLE 500 MG PO TABS
500.0000 mg | ORAL_TABLET | Freq: Two times a day (BID) | ORAL | 0 refills | Status: AC
Start: 2024-01-04 — End: 2024-01-08

## 2024-01-04 MED ORDER — DOXYCYCLINE HYCLATE 100 MG PO CAPS: 100.0000 mg | ORAL_CAPSULE | Freq: Two times a day (BID) | ORAL | 0 refills | Status: AC

## 2024-01-04 NOTE — Progress Notes (Signed)
 GYN VISIT Patient name: Allison Miles MRN [id number]  Date of birth: 11/06/1985 Chief Complaint:   Follow-up  History of Present Illness:   Allison Miles is a 38 y.o. G51P2002 female being seen today for ED follow up:  In review patient was seen in the ED on 11/17 and diagnosed with PID.  She was rated with 10-day course of Doxy and Flagyl .  She notes that it took a few days, but she is to feel better these past 2 days.  She was having episodes of dizziness and feeling light-headed and overall not feeling great-this is all started to get better  Denies fever/chills.  No nausea/vomiting.  Pain has subsided considerable.     Pelvic US  12/28/23: 9.7 x 5.1 x 7 cm uterus.  No fibroids or masses noted.  Normal endometrium.  Right ovary with 3 x 3.1 x 2.7 cm cystic area containing internal low-level echoes.  Adjacent to the left ovary complex cystic structure with internal echoes and septations measuring 8.3 x 4.5 x 5.6 cm.  Of note, she just started her menses.   Not currently sexually active but would consider pregnancy in the future   Patient's last menstrual period was 01/01/2024 (approximate).    Review of Systems:   Pertinent items are noted in HPI Denies fever/chills, dizziness, headaches, visual disturbances, fatigue, shortness of breath, chest pain. Pertinent History Reviewed:   Past Surgical History:  Procedure Laterality Date   CESAREAN SECTION     DIAGNOSTIC LAPAROSCOPY     tacked pelvic ligaments due to prolapse and removed iud   IUD REMOVAL     UMBILICAL HERNIA REPAIR N/A 05/12/2013   Procedure: UMBILICAL HERNIA REPAIR WITHOUT MESH;  Surgeon: Elsie GORMAN Holland, MD;  Location: [facility];  Service: General;  Laterality: N/A;   UTERINE SUSPENSION     VULVECTOMY Bilateral 05/12/2013   Procedure: Labial Reduction/Labioplasty;  Surgeon: Norleen LULLA Server, MD;  Location: [facility];  Service: Gynecology;  Laterality: Bilateral;    Past Medical History:  Diagnosis Date   ADD  (attention deficit disorder) without hyperactivity    Anemia    Asthma    Lumbar herniated disc    Vitamin D deficiency    Reviewed problem list, medications and allergies. Physical Assessment:   Vitals:   01/04/24 1333 01/04/24 1347  BP: (!) 146/84 127/83  Pulse: 82 85  Weight: 242 lb (109.8 kg)   Height: 5' 9.5 (1.765 m)   Body mass index is 35.22 kg/m.       Physical Examination:   General appearance: alert, well appearing, and in no distress  Psych: mood appropriate, normal affect  Skin: warm & dry   Cardiovascular: normal heart rate noted, RRR  Respiratory: normal respiratory effort, no distress  Abdomen: soft, non-tender, no rebound or guarding.  No reproducible pain  Pelvic: examination not indicated  Extremities: no edema   Chaperone: N/A    Assessment & Plan:  1) PID - Plan for full 14-day course of antibiotics - To continue with pelvic rest  2) bilateral ovarian cysts/TOA - Plan for pelvic ultrasound in March when she is due for her annual for reevaluation -pt currently asymptomatic  Follow-up in March for annual exam  Meds ordered this encounter  Medications   metroNIDAZOLE  (FLAGYL ) 500 MG tablet    Sig: Take 1 tablet (500 mg total) by mouth 2 (two) times daily for 4 days.    Dispense:  8 tablet    Refill:  0  doxycycline  (VIBRAMYCIN ) 100 MG capsule    Sig: Take 1 capsule (100 mg total) by mouth 2 (two) times daily for 4 days.    Dispense:  8 capsule    Refill:  0    Orders Placed This Encounter  Procedures   US  PELVIC COMPLETE WITH TRANSVAGINAL    Return for March annual and pelvic US  (March 2026).   Kule Gascoigne, DO Attending Obstetrician & Gynecologist, [facility] for [employer], [facility]

## 2024-01-06 ENCOUNTER — Other Ambulatory Visit (HOSPITAL_BASED_OUTPATIENT_CLINIC_OR_DEPARTMENT_OTHER): Payer: Self-pay | Admitting: Family Medicine

## 2024-01-06 DIAGNOSIS — E278 Other specified disorders of adrenal gland: Secondary | ICD-10-CM

## 2024-01-12 ENCOUNTER — Encounter: Payer: Self-pay | Admitting: Obstetrics & Gynecology
